# Patient Record
Sex: Male | Born: 1960 | Race: White | Hispanic: No | Marital: Married
Health system: Midwestern US, Academic
[De-identification: ages and names within clinical notes are randomized; demographics above are authoritative.]

---

## 2017-03-25 MED ORDER — METHYLPREDNISOLONE ACETATE 80 MG/ML IJ SUSP
80 mg | Freq: Once | INTRAMUSCULAR | 0 refills | Status: CP
Start: 2017-03-25 — End: ?

## 2017-03-25 MED ORDER — IOPAMIDOL 41 % IT SOLN
2.5 mL | Freq: Once | EPIDURAL | 0 refills | Status: CP
Start: 2017-03-25 — End: ?

## 2017-03-25 MED ORDER — BUPIVACAINE (PF) 0.25 % (2.5 MG/ML) IJ SOLN
2 mL | Freq: Once | INTRAMUSCULAR | 0 refills | Status: CP
Start: 2017-03-25 — End: ?

## 2017-07-27 ENCOUNTER — Encounter: Admit: 2017-07-27 | Discharge: 2017-07-27 | Payer: Commercial Managed Care - PPO

## 2017-07-27 DIAGNOSIS — E785 Hyperlipidemia, unspecified: Principal | ICD-10-CM

## 2017-07-27 DIAGNOSIS — R69 Illness, unspecified: Principal | ICD-10-CM

## 2017-07-27 DIAGNOSIS — E039 Hypothyroidism, unspecified: ICD-10-CM

## 2017-07-27 DIAGNOSIS — G8929 Other chronic pain: ICD-10-CM

## 2017-07-27 DIAGNOSIS — M545 Low back pain: ICD-10-CM

## 2017-07-27 DIAGNOSIS — K2970 Gastritis, unspecified, without bleeding: ICD-10-CM

## 2017-07-27 DIAGNOSIS — M79601 Pain in right arm: Secondary | ICD-10-CM

## 2017-07-27 DIAGNOSIS — G2581 Restless legs syndrome: ICD-10-CM

## 2017-07-27 MED ORDER — AMITRIPTYLINE 25 MG PO TAB
ORAL_TABLET | Freq: Every evening | 2 refills | Status: AC
Start: 2017-07-27 — End: 2018-04-29

## 2017-07-27 NOTE — Progress Notes
Date of Service: 07/27/2017     Subjective:               Joshua Poole. is a 56 y.o. male.        History of Present Illness    Joshua Vasquez is a 56 year old right-handed male referred by Dr. Ozzie Hoyle.  He was sent for neurologic opinion because of neck and back pain with radiation into the extremities and also with posture headache.    He has had a several year history of neck pain that is progressed over the past several years.  It is an aching pain with clicking and grinding feeling.  It radiates to both of his arms.  He says it goes down laterally in the arms into the medial digits of both hands.  This is worse with activity.  It is improved with ice.  It happens all day every day and makes it very difficult for him to sleep.    He is also had fears severe headaches.  This is also been going on for several years.  It is located throughout the head but most prominently in the back of the head.  He grades 9/10 at maximum severity.  Headaches happen daily and wake him from sleep.    He is also complained of chronic low back pain for all my life.  He notes a history of 3 lumbar surgeries the last of which was in 2008.  There is pain that radiates posteriorly down both legs more prominently on the left than on the right.  Involves the outer thigh into the front of the leg and top of the foot with associated tingling in the bottom of the left foot.    He is tried gabapentin.  He could not afford Lyrica.  He did not tolerate Cymbalta.  He has had cervical and lumbar injections and nerve ablations.  He is seen anesthesia pain management.  Has never tried tricyclic antidepressant that he can recall.    He is seen at 2 different neurosurgeons.  He says that he was told that there is nothing surgical to do.  He says that a Careers adviser a told him that he could not surgically treat arthritis.  He seen an outside neurologist also but I have no records from that provider. Joshua Vasquez admits to tingling throughout the whole body from his chin down to his toes.  This happens periodically.  He denies bowel or bladder complaints.  There is been weakness in both arms and legs and he finds it difficult to lift, climb or squeeze.  Grip strength has decreased.  It is symmetric and right and left sides.  He has a hard time with stairs, getting out of a chair or out of a car.  He is cautious with walking due to imbalance.    He has multiple other somatic complaints noted in review of systems today.    He had an MRI of the cervical spine done December 03, 2016 at [facility].  I do not have the images unfortunately right now.  According to the report there is cervical spondylosis with neuroforaminal stenosis at multiple levels most pronounced C3-C4.  Evidently there is effacement of the anterior thecal sac with slight cord flattening.  Cervical cord signal and morphology by report was normal.  No syrinx or myelomalacia.  We will get images for review.    MRI lumbar spine December 2016 showed postsurgical changes L3-4 and L4-5 with worsening intervertebral disc space narrowing  and bilateral foraminal stenosis L3-4 and 4-5    MRI brain December 05, 2015 read as age appropriate atrophy.  Otherwise normal contrast-enhanced MRI by report only.    Patient says he even contacted the [facility] but has not had any relief from his symptoms.       Review of Systems   Constitutional: Positive for diaphoresis and fatigue.   HENT: Positive for congestion, hearing loss, rhinorrhea, sinus pain and tinnitus.    Eyes: Positive for photophobia and pain.   Respiratory: Positive for apnea and shortness of breath.    Cardiovascular: Negative.    Gastrointestinal: Negative.    Endocrine: Positive for cold intolerance, heat intolerance and polydipsia.   Genitourinary: Positive for difficulty urinating and urgency.   Musculoskeletal: Positive for arthralgias, back pain, myalgias, neck pain and neck stiffness. Skin: Negative.    Allergic/Immunologic: Positive for environmental allergies.   Neurological: Positive for tremors, weakness and headaches.   Hematological: Negative.    Psychiatric/Behavioral: Positive for agitation and decreased concentration. The patient is nervous/anxious.        Chief Complaint:  Chief Complaint   Patient presents with   ??? Headache   ??? Numbness     and pain worse in upper extremities       Past Medical History:  Past Medical History:   Diagnosis Date   ??? Chronic pain    ??? Dyslipidemia 07/09/2010   ??? Gastritis    ??? Hyperlipemia 07/09/2010   ??? Hypothyroidism    ??? Lower back pain 07/09/2010   ??? Shortness of breath on exertion 07/09/2010       Surgical History:  Past Surgical History:   Procedure Laterality Date   ??? ELBOW SURGERY Right     x2   ??? HAND SURGERY Left     thumb   ??? HX BACK SURGERY     ??? HX CARPAL TUNNEL RELEASE Right    ??? HX TONSILLECTOMY     ??? HX VASECTOMY     ??? KNEE ARTHROSCOPY Right        Social History:   Social History     Social History   ??? Marital status: Married     Spouse name: N/A   ??? Number of children: N/A   ??? Years of education: N/A     Occupational History   ??? Not on file.     Social History Main Topics   ??? Smoking status: Former Smoker     Packs/day: 1.50     Years: 6.00     Types: Cigarettes     Start date: 12/01/1973     Quit date: 12/02/1979   ??? Smokeless tobacco: Former User     Types: Chew     Quit date: 12/01/2012   ??? Alcohol use 1.2 oz/week     1 Glasses of wine, 1 Cans of beer per week      Comment: 1 per week, done some heavy drinking in past   ??? Drug use: Yes     Types: Marijuana      Comment: ointment daily   ??? Sexual activity: Not on file     Other Topics Concern   ??? Not on file     Social History Narrative   ??? No narrative on file       Family History:  Family History   Problem Relation Age of Onset   ??? Hyperlipidemia Mother    ??? Arthritis-osteo Mother    ??? Arthritis-osteo Father    ???  Heart Disease Paternal Grandfather    ??? Cancer Sister         leukemia ??? Hyperlipidemia Brother    ??? Thyroid Disease Maternal Grandmother    ??? Cancer Maternal Grandmother         lung   ??? Hearing Loss Maternal Grandfather    ??? Alcohol abuse Paternal Grandmother        Allergies:  Allergies   Allergen Reactions   ??? Latex, Natural Rubber BLISTERS   ??? Lortab Asa [Hydrocodone-Aspirin] ITCHING   ??? Seasonal Allergies HEADACHE       Objective:         ??? BUPROPION HCL (WELLBUTRIN PO) Take  by mouth.   ??? cetirizine (ZYRTEC) 10 mg PO tablet Take 10 mg by mouth daily.   ??? DOCOSAHEXANOIC ACID/EPA (FISH OIL PO) Take  by mouth daily.     ??? gabapentin (NEURONTIN) 600 mg tablet Take 1 Tab by mouth four times daily.   ??? GARLIC PO Take  by mouth.   ??? GLUCOSAMINE SULFATE PO Take 200 mg by mouth twice daily.   ??? LEVOTHYROXINE SODIUM (LEVOTHYROXINE PO) Take 75 mcg by mouth daily.   ??? OMEPRAZOLE PO Take  by mouth.   ??? oxycodone HCl (OXYCODONE PO) Take 30 mg by mouth every 6 hours as needed.   ??? PRAMIPEXOLE DI-HCL (PRAMIPEXOLE PO) Take 10 mg by mouth.   ??? TURMERIC ROOT EXTRACT PO Take 1 tablet by mouth daily.   ??? ubidecarenone (COQ-10 PO) Take  by mouth.     Vitals:    07/27/17 0806   BP: 104/71   Pulse: 77   Resp: 16   Temp: 36.9 ???C (98.4 ???F)   TempSrc: Oral   SpO2: 96%   Weight: 83.9 kg (185 lb)   Height: 177.8 cm (70)     Body mass index is 26.54 kg/m???.     No Data Recorded  Is a controlled substance agreement on file? Not Applicable      Physical Exam    General: WG/WN/WD, ASA, flat affect, demeanor, attention and memory appropriate, calm, cooperative, follows commands, multiple somatic complaints  HEENT: NC/AT  Cardiac: RRR without murmur  Neck: supple, full passive ROM, no nuchal rigidity  Fundoscopy: clear, sharp disc margins, no papilledema  Speech: fluent, no dysarthria or aphasia  Mental status: Alert, oriented x 4, NAD  CN: VFF without cut/hemianopsia, PERRL, EOMI without restriction or nystagmus, facial sensation symmetric (V1-V3) to LT bilaterally, No facial droop or ptosis, hearing decreased AS compared to AD to FR, palatal rise symmetric, cough response intact, shoulder shrug symmetric, tongue midline  Motor: tone normal, no rigidity or spasticity, decreased ROM in supination BUE  Strength: 5-/5- SA, 5/5 EF/EE/WE/WF/FA, 4/4TA/TO, 5/5HF/KE/DF/PF/AEV/AIV with some bil pain limitation  Mild BUE postural tremor, mild cogwheeling at elbows, no rigidity or spasticity  Sensation: decreased PP to right shin and left ankle, vibration 10 sec left GT, 12 sec right GT, no other LE dermatomal deficit, fluctuation and inconsistent, nondermatomal patter to PP in UEs with no definite nerve pattern   DTRs, 2/2 in BR/B 1/2+P 1/1A, downgoing plantar reflexes bilaterally  Coordination: normal FTN without dysmetria, normal finger tap, coordination normal  Gait: antalgic, unable to stand/walk well on left heel/left toes, mild ataxia in tandem         Assessment and Plan:    1.  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  Cervicogenic headache  3.  Possible bilateral carpal tunnel syndrome  4.  Lumbar spondylosis with possible left sided sciatica  5.  Disturbance of skin sensation.  Consider cervical and/or lumbar radiculopathy.  No exam features of polyneuropathy.  No upper motor neuron deficits on exam either.    Plan:  1.  I suspect many of his symptoms are musculoskeletal rather than neurologic.  2.  We will do EMG and nerve conduction testing both upper and left lower extremities to try to establish whether there is radiculopathy, nerve entrapment or other peripheral nervous system contribution to his symptoms.  He has some exam features suggestive of carpal tunnel syndrome.  3.  I am referring him to medical pain management at the [facility] center.  4.  Staff will get MRI films for my review  5.  Start trial of amitriptyline 25 mg at bedtime for 7 days then 50 mg at bedtime  6.  No further new imaging necessary now 7.  I told patient that it is probable that there will be little that I can offer neurologically for him.  This is particularly true if EMG does not show sign of peripheral nerve lesion.  I will base any further recommendations upon the results of testing.  He will need a chronic medical pain manager given his chronic diffuse probably musculoskeletal pain.  8.  Return to clinic in approximately 3-4 months.  I will see him before then for EMG of both arms and left leg.

## 2017-07-28 ENCOUNTER — Ambulatory Visit: Admit: 2017-07-27 | Discharge: 2017-07-28 | Payer: 59

## 2017-07-28 DIAGNOSIS — R51 Headache: ICD-10-CM

## 2017-07-28 DIAGNOSIS — M542 Cervicalgia: ICD-10-CM

## 2017-07-28 DIAGNOSIS — G8929 Other chronic pain: ICD-10-CM

## 2017-07-28 DIAGNOSIS — R209 Unspecified disturbances of skin sensation: ICD-10-CM

## 2017-07-28 DIAGNOSIS — M5440 Lumbago with sciatica, unspecified side: Principal | ICD-10-CM

## 2017-07-28 DIAGNOSIS — M79602 Pain in left arm: ICD-10-CM

## 2017-08-11 ENCOUNTER — Encounter: Admit: 2017-08-11 | Discharge: 2017-08-11 | Payer: Commercial Managed Care - PPO

## 2017-08-11 NOTE — Telephone Encounter
Returned call to pt. Informed we do have images in our PAC of his MRI Cspine. Put in Dr. Dorris FetchSouthwell's box for review. Added pt to wait list for sooner EMG. Jean RosenthalJackson has reviewed images, and not overly concerned by images.

## 2017-08-31 ENCOUNTER — Encounter: Admit: 2017-08-31 | Discharge: 2017-08-31 | Payer: Commercial Managed Care - PPO

## 2017-08-31 ENCOUNTER — Ambulatory Visit: Admit: 2017-08-31 | Discharge: 2017-09-01 | Payer: 59

## 2017-08-31 DIAGNOSIS — G2581 Restless legs syndrome: ICD-10-CM

## 2017-08-31 DIAGNOSIS — G8929 Other chronic pain: ICD-10-CM

## 2017-08-31 DIAGNOSIS — E039 Hypothyroidism, unspecified: ICD-10-CM

## 2017-08-31 DIAGNOSIS — M79601 Pain in right arm: ICD-10-CM

## 2017-08-31 DIAGNOSIS — M545 Low back pain: ICD-10-CM

## 2017-08-31 DIAGNOSIS — E785 Hyperlipidemia, unspecified: Secondary | ICD-10-CM

## 2017-08-31 DIAGNOSIS — K2970 Gastritis, unspecified, without bleeding: ICD-10-CM

## 2017-08-31 DIAGNOSIS — M542 Cervicalgia: ICD-10-CM

## 2017-08-31 DIAGNOSIS — M5440 Lumbago with sciatica, unspecified side: ICD-10-CM

## 2017-08-31 NOTE — Procedures
Please refer to EMG report for study results.  No separate note placed for procedure.  Patient well tolerated the procedure and was sent home with no complications.  Because of chest/thoracic numbness and tingling, I have ordered MRI thoracic spine without GAD.

## 2017-09-01 DIAGNOSIS — R200 Anesthesia of skin: ICD-10-CM

## 2017-09-01 DIAGNOSIS — R202 Paresthesia of skin: Secondary | ICD-10-CM

## 2017-09-01 DIAGNOSIS — M47812 Spondylosis without myelopathy or radiculopathy, cervical region: Principal | ICD-10-CM

## 2017-09-21 ENCOUNTER — Encounter: Admit: 2017-09-21 | Discharge: 2017-09-21 | Payer: Commercial Managed Care - PPO

## 2017-09-21 NOTE — Progress Notes
Faxed order for MRI Thoracic Spine to [facility] @ [phone] per patient request.

## 2017-09-24 ENCOUNTER — Encounter: Admit: 2017-09-24 | Discharge: 2017-09-25 | Payer: Commercial Managed Care - PPO

## 2017-09-24 DIAGNOSIS — R69 Illness, unspecified: Principal | ICD-10-CM

## 2017-11-04 ENCOUNTER — Encounter: Admit: 2017-11-04 | Discharge: 2017-11-04 | Payer: Commercial Managed Care - PPO

## 2017-11-04 ENCOUNTER — Ambulatory Visit: Admit: 2017-11-04 | Discharge: 2017-11-05 | Payer: 59

## 2017-11-04 DIAGNOSIS — G8929 Other chronic pain: ICD-10-CM

## 2017-11-04 DIAGNOSIS — G2581 Restless legs syndrome: ICD-10-CM

## 2017-11-04 DIAGNOSIS — K2970 Gastritis, unspecified, without bleeding: ICD-10-CM

## 2017-11-04 DIAGNOSIS — E039 Hypothyroidism, unspecified: ICD-10-CM

## 2017-11-04 DIAGNOSIS — M545 Low back pain: ICD-10-CM

## 2017-11-04 DIAGNOSIS — E785 Hyperlipidemia, unspecified: Secondary | ICD-10-CM

## 2017-11-04 MED ORDER — GABAPENTIN 600 MG PO TAB
600 mg | ORAL_TABLET | Freq: Four times a day (QID) | ORAL | 3 refills | Status: CN
Start: 2017-11-04 — End: ?

## 2017-11-04 MED ORDER — AMITRIPTYLINE 25 MG PO TAB
25 mg | ORAL_TABLET | Freq: Every evening | ORAL | 3 refills | Status: CN
Start: 2017-11-04 — End: ?

## 2017-11-04 NOTE — Progress Notes
Date of Service: 11/04/2017     Subjective:               Joshua Vasquez. is a 56 y.o. male.        History of Present Illness    Headache and neck pain has been unchanged.  He still has neck pain that radiates into both arms with numbness and tingling.  EMG showed only a mild left ulnar entrapment at the elbow with no radiculopathy or plexopathy.    Headache is also been unchanged.  It happens every day.  It starts by coming from the neck into the left occipital region and then traveling globally.  I put him on amitriptyline which he did not tolerate in the centimeter him feel goofy.  I also referred him to the [facility] but he says he cannot afford it and insurance did not cover it.  Headache is persistent lasting well more than 4 hours.  He admits to some periods of light and noise sensitivity.    I looked at the MRI of the cervical spine that he had in January 2018.  It was abnormal with mild central stenosis and multilevel neuroforaminal stenosis.  No cord parenchymal abnormality was present.    MRI thoracic spine also was done.  I do not have the report or images right now for my review.  We have requested them from [facility].    He has not had any new neurological complaints.       Review of Systems   Constitutional: Positive for diaphoresis.   HENT: Positive for hearing loss, rhinorrhea, sinus pain and tinnitus.    Eyes: Positive for photophobia and itching.   Respiratory: Positive for apnea, cough and shortness of breath.    Cardiovascular: Negative.    Gastrointestinal: Negative.    Endocrine: Negative.    Genitourinary: Negative.    Musculoskeletal: Positive for arthralgias, back pain, neck pain and neck stiffness.   Skin: Negative.    Allergic/Immunologic: Positive for environmental allergies.   Neurological: Positive for tremors, weakness, numbness and headaches. Negative for seizures.   Hematological: Negative.    Psychiatric/Behavioral: Positive for agitation, decreased concentration and sleep disturbance.       Chief Complaint:  Chief Complaint   Patient presents with   ??? Headache     Have not changed   ??? Numbness     in BLE /more on the LT side as of now       Past Medical History:  Past Medical History:   Diagnosis Date   ??? Chronic pain    ??? Dyslipidemia 07/09/2010   ??? Gastritis    ??? Hyperlipemia 07/09/2010   ??? Hypothyroidism    ??? Lower back pain 07/09/2010   ??? RLS (restless legs syndrome)        Surgical History:  Past Surgical History:   Procedure Laterality Date   ??? ELBOW SURGERY Right     x2   ??? HAND SURGERY Left     thumb   ??? HX BACK SURGERY     ??? HX CARPAL TUNNEL RELEASE Right    ??? HX TONSILLECTOMY     ??? HX VASECTOMY     ??? KNEE ARTHROSCOPY Right        Social History:   Social History     Social History   ??? Marital status: Married     Spouse name: N/A   ??? Number of children: N/A   ??? Years of education:  N/A     Occupational History   ??? Not on file.     Social History Main Topics   ??? Smoking status: Former Smoker     Packs/day: 1.50     Years: 6.00     Types: Cigarettes     Start date: 12/01/1973     Quit date: 12/02/1979   ??? Smokeless tobacco: Former User     Types: Chew     Quit date: 12/01/2012   ??? Alcohol use 1.2 oz/week     1 Glasses of wine, 1 Cans of beer per week      Comment: 1 per week, done some heavy drinking in past   ??? Drug use: Yes     Types: Marijuana      Comment: ointment daily   ??? Sexual activity: Not on file     Other Topics Concern   ??? Not on file     Social History Narrative   ??? No narrative on file       Family History:  Family History   Problem Relation Age of Onset   ??? Hyperlipidemia Mother    ??? Arthritis-osteo Mother    ??? Arthritis-osteo Father    ??? Cancer Sister         leukemia   ??? Hyperlipidemia Brother    ??? Cancer Maternal Grandmother         lung       Allergies:  Allergies   Allergen Reactions   ??? Latex, Natural Rubber BLISTERS   ??? Lortab Asa [Hydrocodone-Aspirin] ITCHING   ??? Seasonal Allergies HEADACHE       Objective: ??? amitriptyline (ELAVIL) 25 mg tablet Take 1/2 tab at bedtime for 1 week then 1 tab at bedtime   ??? BUPROPION HCL (WELLBUTRIN PO) Take 150 mg by mouth.   ??? cetirizine (ZYRTEC) 10 mg PO tablet Take 10 mg by mouth daily.   ??? DOCOSAHEXANOIC ACID/EPA (FISH OIL PO) Take  by mouth daily.     ??? gabapentin (NEURONTIN) 600 mg tablet Take 1 Tab by mouth four times daily.   ??? GARLIC PO Take  by mouth.   ??? GLUCOSAMINE SULFATE PO Take 200 mg by mouth twice daily.   ??? LEVOTHYROXINE SODIUM (LEVOTHYROXINE PO) Take 75 mcg by mouth daily.   ??? OMEPRAZOLE PO Take  by mouth.   ??? oxycodone HCl (OXYCODONE PO) Take 30 mg by mouth every 6 hours as needed.   ??? PRAMIPEXOLE DI-HCL (PRAMIPEXOLE PO) Take 10 mg by mouth.   ??? TURMERIC ROOT EXTRACT PO Take 1 tablet by mouth daily.   ??? ubidecarenone (COQ-10 PO) Take  by mouth.     Vitals:    11/04/17 1056   BP: 125/85   Pulse: 83   Resp: 16   Temp: 37.1 ???C (98.7 ???F)   TempSrc: Oral   SpO2: 96%   Weight: 85.7 kg (189 lb)   Height: 177.8 cm (70)     Body mass index is 27.12 kg/m???.     No Data Recorded  Is a controlled substance agreement on file? Not Applicable      Physical Exam    General: WG/WN, flat demeanor  HEENT: NC/AT  Cardiac: RRR without murmur  Speech: fluent, no dysarthria or aphasia  Mental status: Alert, oriented x 4, NAD  CN: PERRL, EOMI, facial sensation symmetric (V1-V3) to LT bilaterally, No facial droop or ptosis, palatal rise symmetric,tongue midline  Strength: 5/5 SA/EF/EE/WE/WF/HF/KE/DF/PF  Sensation: Mild chronic decreased right medial  leg below knee, no other consistent dermatomal deficit in arms or legs    DTRs, 2/2 in BR/B 1/2P 1/1A, downgoing plantar reflexes bilaterally  Gait: antalgic, no ataxia primary gait   ???     Assessment and Plan:    1.  Chronic cervicogenic headache  2.  Chronic cervicalgia with bilateral upper extremity pain.  No evidence cervical radiculopathy  3.  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  Cervical spondylosis without myelopathy or radiculopathy  5.  Lumbar spondylosis    Plan:  1.  I asked my staff to get MRI thoracic spine results from [facility].  I also asked him to call me tomorrow for report since I do not have it now.  2.  I do not think his symptoms are neurologic.  I think they are more likely to be musculoskeletal.  3.  He was not able to [facility] to the [facility].  Insurance evidently did not cover going over there.  Check to see if there is another medical pain manager who will accept his insurance.  I think he needs chronic medical pain management.  4.  He has had prior cervical injections.  He already saw Dr. Park Breed in anesthesia pain management.  5  I have nothing else neurologically to offer him.  Discharge to the care of his primary care provider and medical pain management as referred. RTC prn.

## 2017-11-04 NOTE — Telephone Encounter
Vm, MRI t spine from [facility] received and reviewed by Dr. Sunday CornSouthwell. Largely arthritic changes and he is not overly concerned with results. Please call back if you have questions.

## 2017-11-05 ENCOUNTER — Encounter: Admit: 2017-11-05 | Discharge: 2017-11-05 | Payer: Commercial Managed Care - PPO

## 2017-11-05 DIAGNOSIS — R51 Headache: Principal | ICD-10-CM

## 2017-11-05 DIAGNOSIS — R69 Illness, unspecified: Principal | ICD-10-CM

## 2018-01-25 ENCOUNTER — Encounter: Admit: 2018-01-25 | Discharge: 2018-01-26 | Payer: Commercial Managed Care - PPO

## 2018-03-04 ENCOUNTER — Ambulatory Visit: Admit: 2018-03-04 | Discharge: 2018-03-05

## 2018-03-05 DIAGNOSIS — M79644 Pain in right finger(s): Principal | ICD-10-CM

## 2018-03-06 ENCOUNTER — Encounter: Admit: 2018-03-06 | Discharge: 2018-03-06 | Payer: Commercial Managed Care - PPO

## 2018-03-24 ENCOUNTER — Encounter: Admit: 2018-03-24 | Discharge: 2018-03-24 | Payer: Commercial Managed Care - PPO

## 2018-04-29 ENCOUNTER — Encounter: Admit: 2018-04-29 | Discharge: 2018-04-29 | Payer: Commercial Managed Care - PPO

## 2018-04-29 ENCOUNTER — Ambulatory Visit: Admit: 2018-04-29 | Discharge: 2018-04-30

## 2018-04-29 DIAGNOSIS — G8929 Other chronic pain: ICD-10-CM

## 2018-04-29 DIAGNOSIS — G2581 Restless legs syndrome: ICD-10-CM

## 2018-04-29 DIAGNOSIS — M545 Low back pain: ICD-10-CM

## 2018-04-29 DIAGNOSIS — E039 Hypothyroidism, unspecified: ICD-10-CM

## 2018-04-29 DIAGNOSIS — E785 Hyperlipidemia, unspecified: Principal | ICD-10-CM

## 2018-04-29 DIAGNOSIS — K2970 Gastritis, unspecified, without bleeding: ICD-10-CM

## 2018-04-29 MED ORDER — BETAMETHASONE ACET,SOD PHOS 6 MG/ML IJ SUSP
6 mg | Freq: Once | INTRAMUSCULAR | 0 refills | Status: CP | PRN
Start: 2018-04-29 — End: ?
  Administered 2018-04-29: 15:00:00 6 mg via INTRAMUSCULAR

## 2018-04-29 MED ORDER — LIDOCAINE (PF) 10 MG/ML (1 %) IJ SOLN
3 mL | Freq: Once | INTRAMUSCULAR | 0 refills | Status: CP | PRN
Start: 2018-04-29 — End: ?
  Administered 2018-04-29: 15:00:00 3 mL via INTRAMUSCULAR

## 2018-04-30 DIAGNOSIS — M65331 Trigger finger, right middle finger: Principal | ICD-10-CM

## 2018-07-15 ENCOUNTER — Ambulatory Visit: Admit: 2018-07-15 | Discharge: 2018-07-16 | Payer: Commercial Managed Care - PPO

## 2018-07-16 DIAGNOSIS — M65331 Trigger finger, right middle finger: Principal | ICD-10-CM

## 2018-08-20 ENCOUNTER — Ambulatory Visit: Admit: 2018-08-20 | Discharge: 2018-08-20 | Payer: Commercial Managed Care - PPO

## 2018-08-20 ENCOUNTER — Encounter: Admit: 2018-08-20 | Discharge: 2018-08-20 | Payer: Commercial Managed Care - PPO

## 2018-08-20 DIAGNOSIS — M24541 Contracture, right hand: Principal | ICD-10-CM

## 2018-08-20 MED ORDER — CEFAZOLIN 1 GRAM IJ SOLR
2 g | Freq: Once | INTRAVENOUS | 0 refills | Status: CN
Start: 2018-08-20 — End: ?

## 2018-10-21 ENCOUNTER — Encounter: Admit: 2018-10-21 | Discharge: 2018-10-22 | Payer: Commercial Managed Care - PPO

## 2018-10-27 ENCOUNTER — Encounter: Admit: 2018-10-27 | Discharge: 2018-10-27 | Payer: Commercial Managed Care - PPO

## 2018-10-27 ENCOUNTER — Ambulatory Visit: Admit: 2018-10-27 | Discharge: 2018-10-28 | Payer: Commercial Managed Care - PPO

## 2018-10-27 DIAGNOSIS — E785 Hyperlipidemia, unspecified: Secondary | ICD-10-CM

## 2018-10-27 DIAGNOSIS — S46212A Strain of muscle, fascia and tendon of other parts of biceps, left arm, initial encounter: Principal | ICD-10-CM

## 2018-10-27 DIAGNOSIS — G2581 Restless legs syndrome: ICD-10-CM

## 2018-10-27 DIAGNOSIS — M545 Low back pain: ICD-10-CM

## 2018-10-27 DIAGNOSIS — K2970 Gastritis, unspecified, without bleeding: ICD-10-CM

## 2018-10-27 DIAGNOSIS — G8929 Other chronic pain: ICD-10-CM

## 2018-10-27 DIAGNOSIS — E039 Hypothyroidism, unspecified: ICD-10-CM

## 2018-10-28 DIAGNOSIS — S46212A Strain of muscle, fascia and tendon of other parts of biceps, left arm, initial encounter: Principal | ICD-10-CM

## 2018-11-01 DIAGNOSIS — S46219A Strain of muscle, fascia and tendon of other parts of biceps, unspecified arm, initial encounter: ICD-10-CM

## 2018-11-02 ENCOUNTER — Encounter: Admit: 2018-11-02 | Discharge: 2018-11-02 | Payer: Commercial Managed Care - PPO

## 2018-11-02 DIAGNOSIS — G2581 Restless legs syndrome: ICD-10-CM

## 2018-11-02 DIAGNOSIS — G4733 Obstructive sleep apnea (adult) (pediatric): ICD-10-CM

## 2018-11-02 DIAGNOSIS — E785 Hyperlipidemia, unspecified: ICD-10-CM

## 2018-11-02 DIAGNOSIS — K219 Gastro-esophageal reflux disease without esophagitis: ICD-10-CM

## 2018-11-02 DIAGNOSIS — E039 Hypothyroidism, unspecified: ICD-10-CM

## 2018-11-02 DIAGNOSIS — M545 Low back pain: Principal | ICD-10-CM

## 2018-11-12 ENCOUNTER — Encounter: Admit: 2018-11-12 | Discharge: 2018-11-12 | Payer: Commercial Managed Care - PPO

## 2018-11-12 ENCOUNTER — Ambulatory Visit: Admit: 2018-11-12 | Discharge: 2018-11-12 | Payer: Commercial Managed Care - PPO

## 2018-11-12 ENCOUNTER — Encounter: Admit: 2018-11-12 | Discharge: 2018-11-13 | Payer: Commercial Managed Care - PPO

## 2018-11-12 DIAGNOSIS — S46212A Strain of muscle, fascia and tendon of other parts of biceps, left arm, initial encounter: Principal | ICD-10-CM

## 2018-11-12 DIAGNOSIS — G4733 Obstructive sleep apnea (adult) (pediatric): ICD-10-CM

## 2018-11-12 DIAGNOSIS — Z885 Allergy status to narcotic agent status: ICD-10-CM

## 2018-11-12 DIAGNOSIS — G2581 Restless legs syndrome: ICD-10-CM

## 2018-11-12 DIAGNOSIS — K219 Gastro-esophageal reflux disease without esophagitis: ICD-10-CM

## 2018-11-12 DIAGNOSIS — M545 Low back pain: Principal | ICD-10-CM

## 2018-11-12 DIAGNOSIS — E039 Hypothyroidism, unspecified: ICD-10-CM

## 2018-11-12 DIAGNOSIS — E785 Hyperlipidemia, unspecified: ICD-10-CM

## 2018-11-12 MED ORDER — ACETAMINOPHEN 1,000 MG/100 ML (10 MG/ML) IV SOLN
0 refills | Status: DC
Start: 2018-11-12 — End: 2018-11-12
  Administered 2018-11-12: 17:00:00 1000 mg via INTRAVENOUS

## 2018-11-12 MED ORDER — FENTANYL CITRATE (PF) 50 MCG/ML IJ SOLN
0 refills | Status: DC
Start: 2018-11-12 — End: 2018-11-12
  Administered 2018-11-12: 16:00:00 50 ug via INTRAVENOUS
  Administered 2018-11-12 (×2): 25 ug via INTRAVENOUS
  Administered 2018-11-12 (×3): 50 ug via INTRAVENOUS

## 2018-11-12 MED ORDER — LACTATED RINGERS IV SOLP
INTRAVENOUS | 0 refills | Status: DC
Start: 2018-11-12 — End: 2018-11-12
  Administered 2018-11-12: 15:00:00 1000.000 mL via INTRAVENOUS

## 2018-11-12 MED ORDER — PROPOFOL INJ 10 MG/ML IV VIAL
0 refills | Status: DC
Start: 2018-11-12 — End: 2018-11-12
  Administered 2018-11-12: 16:00:00 200 mg via INTRAVENOUS

## 2018-11-12 MED ORDER — OXYCODONE 5 MG PO TAB
5-10 mg | ORAL_TABLET | ORAL | 0 refills | 6.00000 days | Status: AC | PRN
Start: 2018-11-12 — End: 2019-01-04
  Filled 2018-11-12 (×2): qty 50, 5d supply, fill #1

## 2018-11-12 MED ORDER — MIDAZOLAM 1 MG/ML IJ SOLN
INTRAVENOUS | 0 refills | Status: DC
Start: 2018-11-12 — End: 2018-11-12
  Administered 2018-11-12: 15:00:00 2 mg via INTRAVENOUS

## 2018-11-12 MED ORDER — OXYCODONE 5 MG PO TAB
5-10 mg | Freq: Once | ORAL | 0 refills | Status: CP
Start: 2018-11-12 — End: ?

## 2018-11-12 MED ORDER — ONDANSETRON HCL (PF) 4 MG/2 ML IJ SOLN
INTRAVENOUS | 0 refills | Status: DC
Start: 2018-11-12 — End: 2018-11-12
  Administered 2018-11-12: 16:00:00 4 mg via INTRAVENOUS

## 2018-11-12 MED ORDER — BUPIVACAINE 0.5 % (5 MG/ML) IJ SOLN
0 refills | Status: DC
Start: 2018-11-12 — End: 2018-11-12
  Administered 2018-11-12: 18:00:00 15 mL via INTRAMUSCULAR

## 2018-11-12 MED ORDER — NAPROXEN 500 MG PO TAB
500 mg | ORAL_TABLET | Freq: Two times a day (BID) | ORAL | 1 refills | Status: AC
Start: 2018-11-12 — End: 2020-05-17
  Filled 2018-11-12 (×2): qty 60, 30d supply, fill #1

## 2018-11-12 MED ORDER — PROMETHAZINE 25 MG/ML IJ SOLN
6.25 mg | INTRAVENOUS | 0 refills | Status: DC | PRN
Start: 2018-11-12 — End: 2018-11-12

## 2018-11-12 MED ORDER — PHENYLEPHRINE IN 0.9% NACL(PF) 1 MG/10 ML (100 MCG/ML) IV SYRG
INTRAVENOUS | 0 refills | Status: DC
Start: 2018-11-12 — End: 2018-11-12
  Administered 2018-11-12: 16:00:00 100 ug via INTRAVENOUS

## 2018-11-12 MED ORDER — LIDOCAINE (PF) 200 MG/10 ML (2 %) IJ SYRG
0 refills | Status: DC
Start: 2018-11-12 — End: 2018-11-12
  Administered 2018-11-12: 16:00:00 50 mg via INTRAVENOUS

## 2018-11-12 MED ORDER — FENTANYL CITRATE (PF) 50 MCG/ML IJ SOLN
50 ug | INTRAVENOUS | 0 refills | Status: DC | PRN
Start: 2018-11-12 — End: 2018-11-12
  Administered 2018-11-12 (×3): 50 ug via INTRAVENOUS

## 2018-11-12 MED ORDER — LIDOCAINE (PF) 10 MG/ML (1 %) IJ SOLN
.1-2 mL | INTRAMUSCULAR | 0 refills | Status: DC | PRN
Start: 2018-11-12 — End: 2018-11-12
  Administered 2018-11-12: 15:00:00 2 mL via INTRAMUSCULAR

## 2018-11-12 MED ORDER — CEFAZOLIN 1 GRAM IJ SOLR
0 refills | Status: DC
Start: 2018-11-12 — End: 2018-11-12
  Administered 2018-11-12: 16:00:00 2 g via INTRAVENOUS

## 2018-11-12 MED ORDER — HYDROMORPHONE (PF) 2 MG/ML IJ SYRG
2 mg | INTRAVENOUS | 0 refills | Status: DC | PRN
Start: 2018-11-12 — End: 2018-11-12
  Administered 2018-11-12: 19:00:00 2 mg via INTRAVENOUS
  Administered 2018-11-12: 19:00:00 1 mg via INTRAVENOUS

## 2018-11-12 MED ORDER — DEXAMETHASONE SODIUM PHOSPHATE 4 MG/ML IJ SOLN
INTRAVENOUS | 0 refills | Status: DC
Start: 2018-11-12 — End: 2018-11-12
  Administered 2018-11-12: 16:00:00 4 mg via INTRAVENOUS

## 2018-11-12 MED ADMIN — FENTANYL CITRATE (PF) 50 MCG/ML IJ SOLN [3037]: 50 ug | INTRAVENOUS | @ 18:00:00 | Stop: 2018-11-12 | NDC 00641602701

## 2018-11-12 MED ADMIN — OXYCODONE 5 MG PO TAB [10814]: 5 mg | ORAL | @ 19:00:00 | Stop: 2018-11-12 | NDC 00406055223

## 2018-11-15 ENCOUNTER — Encounter: Admit: 2018-11-15 | Discharge: 2018-11-15 | Payer: Commercial Managed Care - PPO

## 2018-11-15 DIAGNOSIS — E039 Hypothyroidism, unspecified: ICD-10-CM

## 2018-11-15 DIAGNOSIS — M545 Low back pain: Principal | ICD-10-CM

## 2018-11-15 DIAGNOSIS — E785 Hyperlipidemia, unspecified: ICD-10-CM

## 2018-11-15 DIAGNOSIS — G2581 Restless legs syndrome: ICD-10-CM

## 2018-11-15 DIAGNOSIS — K219 Gastro-esophageal reflux disease without esophagitis: ICD-10-CM

## 2018-11-15 DIAGNOSIS — G4733 Obstructive sleep apnea (adult) (pediatric): ICD-10-CM

## 2018-12-07 ENCOUNTER — Ambulatory Visit: Admit: 2018-12-07 | Discharge: 2018-12-08 | Payer: Commercial Managed Care - PPO

## 2018-12-08 DIAGNOSIS — S46212A Strain of muscle, fascia and tendon of other parts of biceps, left arm, initial encounter: Secondary | ICD-10-CM

## 2018-12-31 ENCOUNTER — Encounter: Admit: 2018-12-31 | Discharge: 2018-12-31 | Payer: Commercial Managed Care - PPO

## 2019-01-04 ENCOUNTER — Encounter: Admit: 2019-01-04 | Discharge: 2019-01-04 | Payer: Commercial Managed Care - PPO

## 2019-01-04 ENCOUNTER — Ambulatory Visit: Admit: 2019-01-04 | Discharge: 2019-01-05 | Payer: Commercial Managed Care - PPO

## 2019-01-04 DIAGNOSIS — G2581 Restless legs syndrome: ICD-10-CM

## 2019-01-04 DIAGNOSIS — M545 Low back pain: Principal | ICD-10-CM

## 2019-01-04 DIAGNOSIS — G4733 Obstructive sleep apnea (adult) (pediatric): ICD-10-CM

## 2019-01-04 DIAGNOSIS — E785 Hyperlipidemia, unspecified: ICD-10-CM

## 2019-01-04 DIAGNOSIS — K219 Gastro-esophageal reflux disease without esophagitis: ICD-10-CM

## 2019-01-04 DIAGNOSIS — E039 Hypothyroidism, unspecified: ICD-10-CM

## 2019-01-05 ENCOUNTER — Encounter: Admit: 2019-01-05 | Discharge: 2019-01-05 | Payer: Commercial Managed Care - PPO

## 2019-01-05 DIAGNOSIS — M5416 Radiculopathy, lumbar region: Principal | ICD-10-CM

## 2019-01-05 NOTE — Progress Notes
CC:  Chief Complaint   Patient presents with   ??? Post-op     Left bicep         S:  58 year old male return to clinic for evaluation of his left distal biceps repair 11/12/2018.  He reports his pain is well controlled he is currently very satisfied with his left arm.  He has been compliant with his restrictions he denies paresthesias.   unfortunately, he is developed a significant recurrence of his lower back pain and paresthesias into his bilateral legs.  He has a complicated history regarding his back he seen multiple spine surgeons.     O:  Left upper extremity:  He has a range of motion from 0 to 140 degrees of flexion.  He has a supination to 30 degrees which is his baseline.  He has full pronation.  Normal hook test no pain with palpation along the distal biceps tendon.  Visions are well approximated without erythema or swelling      Assessment/Plan:    58 year old male status post left distal biceps repair     -Patient may now transition out of his brace throughout the day.  He may continue all active motion as tolerated.  We will get him into active strengthening with therapy   -Place a referral to our spine team to be reevaluated for his lumbar radiculopathy      Jerline Pain, MD  Assistant Professor  Hand Upper Extremity and Microsurgery  Department of Orthopaedic Surgery

## 2019-01-21 ENCOUNTER — Encounter: Admit: 2019-01-21 | Discharge: 2019-01-21 | Payer: Commercial Managed Care - PPO

## 2019-01-21 DIAGNOSIS — M549 Dorsalgia, unspecified: ICD-10-CM

## 2019-01-21 DIAGNOSIS — M5440 Lumbago with sciatica, unspecified side: Principal | ICD-10-CM

## 2019-02-01 ENCOUNTER — Encounter: Admit: 2019-02-01 | Discharge: 2019-02-02 | Payer: Commercial Managed Care - PPO

## 2019-02-08 ENCOUNTER — Encounter: Admit: 2019-02-08 | Discharge: 2019-02-08 | Payer: Commercial Managed Care - PPO

## 2019-02-08 ENCOUNTER — Ambulatory Visit: Admit: 2019-02-08 | Discharge: 2019-02-09 | Payer: Commercial Managed Care - PPO

## 2019-02-08 DIAGNOSIS — K219 Gastro-esophageal reflux disease without esophagitis: ICD-10-CM

## 2019-02-08 DIAGNOSIS — E039 Hypothyroidism, unspecified: ICD-10-CM

## 2019-02-08 DIAGNOSIS — G4733 Obstructive sleep apnea (adult) (pediatric): ICD-10-CM

## 2019-02-08 DIAGNOSIS — E785 Hyperlipidemia, unspecified: ICD-10-CM

## 2019-02-08 DIAGNOSIS — G2581 Restless legs syndrome: ICD-10-CM

## 2019-02-08 DIAGNOSIS — M545 Low back pain: Principal | ICD-10-CM

## 2019-02-08 NOTE — Progress Notes
11/12/2018.  He has recovered very well following his distal biceps repair.  He is currently very happy with his result.  He is now increasing his activity without restriction.  He is continuing with physical therapy.  Today he has complaints of a recurrence of her right long finger triggering.  He had a release 1 to 2 years ago but symptoms recurred last fall he saw my partner Dr. Maryclare Bean who provided an injection which provided him about 4 to 6 weeks of relief.  He has had a recurrence of the symptoms.  He notes having to actively extend his finger most often in the mornings he denies paresthesias      Past Medical History:  Medical History:   Diagnosis Date   ??? Acid reflux    ??? Hyperlipemia 07/09/2010   ??? Hypothyroidism    ??? Lower back pain 07/09/2010   ??? OSA on CPAP     doesnt use currently, cannot tolerate   ??? RLS (restless legs syndrome)        Past Surgical History:  Surgical History:   Procedure Laterality Date   ??? HX BACK SURGERY  1995    L 3-5   ??? REINSERTION RUPTURED BICEPS/ TRICEPS TENDON WITH/ WITHOUT TENDON GRAFT - DISTAL Left 11/12/2018    Performed by Jerline Pain, MD at [facility] OR/PERIOP   ??? ELBOW SURGERY Right     x2   ??? HAND SURGERY Left     thumb   ??? HX CARPAL TUNNEL RELEASE Right    ??? HX TONSILLECTOMY     ??? HX VASECTOMY     ??? KNEE ARTHROSCOPY Right    ??? KNEE ARTHROSCOPY Left        Family History:  Family History   Problem Relation Age of Onset   ??? Hyperlipidemia Mother    ??? Arthritis-osteo Mother    ??? Arthritis-osteo Father    ??? Cancer Sister         leukemia   ??? Hyperlipidemia Brother    ??? Cancer Maternal Grandmother         lung     Social History:   Social History     Socioeconomic History   ??? Marital status: Married     Spouse name: Not on file   ??? Number of children: Not on file   ??? Years of education: Not on file   ??? Highest education level: Not on file   Occupational History   ??? Not on file   Tobacco Use   ??? Smoking status: Former Smoker     Packs/day: 1.50     Years: 6.00

## 2019-02-09 DIAGNOSIS — M65331 Trigger finger, right middle finger: Principal | ICD-10-CM

## 2019-02-11 MED ORDER — LIDOCAINE (PF) 10 MG/ML (1 %) IJ SOLN
1 mL | Freq: Once | INTRAMUSCULAR | 0 refills | Status: CP | PRN
Start: 2019-02-11 — End: ?
  Administered 2019-02-08: 16:00:00 1 mL via INTRAMUSCULAR

## 2019-02-11 MED ORDER — TRIAMCINOLONE ACETONIDE 40 MG/ML IJ SUSP
40 mg | Freq: Once | INTRAMUSCULAR | 0 refills | Status: CP | PRN
Start: 2019-02-11 — End: ?
  Administered 2019-02-08: 16:00:00 40 mg via INTRAMUSCULAR

## 2019-02-17 ENCOUNTER — Encounter: Admit: 2019-02-17 | Discharge: 2019-02-17 | Payer: Commercial Managed Care - PPO

## 2019-02-17 DIAGNOSIS — M549 Dorsalgia, unspecified: Principal | ICD-10-CM

## 2019-02-23 ENCOUNTER — Encounter: Admit: 2019-02-23 | Discharge: 2019-02-23 | Payer: Commercial Managed Care - PPO

## 2019-02-23 ENCOUNTER — Ambulatory Visit: Admit: 2019-02-23 | Discharge: 2019-02-23 | Payer: Commercial Managed Care - PPO

## 2019-02-23 DIAGNOSIS — K219 Gastro-esophageal reflux disease without esophagitis: ICD-10-CM

## 2019-02-23 DIAGNOSIS — M545 Low back pain: Principal | ICD-10-CM

## 2019-02-23 DIAGNOSIS — E785 Hyperlipidemia, unspecified: ICD-10-CM

## 2019-02-23 DIAGNOSIS — M5416 Radiculopathy, lumbar region: ICD-10-CM

## 2019-02-23 DIAGNOSIS — G4733 Obstructive sleep apnea (adult) (pediatric): ICD-10-CM

## 2019-02-23 DIAGNOSIS — E039 Hypothyroidism, unspecified: ICD-10-CM

## 2019-02-23 DIAGNOSIS — M549 Dorsalgia, unspecified: Principal | ICD-10-CM

## 2019-02-23 DIAGNOSIS — G2581 Restless legs syndrome: ICD-10-CM

## 2019-02-23 DIAGNOSIS — M47817 Spondylosis without myelopathy or radiculopathy, lumbosacral region: ICD-10-CM

## 2019-06-25 ENCOUNTER — Encounter: Admit: 2019-06-25 | Discharge: 2019-06-25

## 2020-02-29 ENCOUNTER — Encounter: Admit: 2020-02-29 | Discharge: 2020-02-29 | Payer: Commercial Managed Care - PPO

## 2020-05-17 ENCOUNTER — Ambulatory Visit: Admit: 2020-05-17 | Discharge: 2020-05-17 | Payer: Commercial Managed Care - PPO

## 2020-05-17 ENCOUNTER — Encounter: Admit: 2020-05-17 | Discharge: 2020-05-17 | Payer: Commercial Managed Care - PPO

## 2020-05-17 DIAGNOSIS — K219 Gastro-esophageal reflux disease without esophagitis: Secondary | ICD-10-CM

## 2020-05-17 DIAGNOSIS — G5793 Unspecified mononeuropathy of bilateral lower limbs: Secondary | ICD-10-CM

## 2020-05-17 DIAGNOSIS — E785 Hyperlipidemia, unspecified: Secondary | ICD-10-CM

## 2020-05-17 DIAGNOSIS — G4733 Obstructive sleep apnea (adult) (pediatric): Secondary | ICD-10-CM

## 2020-05-17 DIAGNOSIS — G2581 Restless legs syndrome: Secondary | ICD-10-CM

## 2020-05-17 DIAGNOSIS — M545 Low back pain: Secondary | ICD-10-CM

## 2020-05-17 DIAGNOSIS — E039 Hypothyroidism, unspecified: Secondary | ICD-10-CM

## 2020-05-22 ENCOUNTER — Encounter: Admit: 2020-05-22 | Discharge: 2020-05-22 | Payer: Commercial Managed Care - PPO

## 2020-05-22 NOTE — Telephone Encounter
Pt was seen in clinic with Joshua Vasquez and Dr Welton Flakes 05/17/2020.At that time he was on pregabailin 200 mg BID. He says that Dr Welton Flakes and Sabra Heck indicated they were going to increase him to 300 mg BID, however there is no documentaion to that effect.

## 2020-05-25 MED ORDER — PREGABALIN 300 MG PO CAP
300 mg | ORAL_CAPSULE | Freq: Two times a day (BID) | ORAL | 3 refills | Status: AC
Start: 2020-05-25 — End: ?

## 2020-05-30 ENCOUNTER — Encounter: Admit: 2020-05-30 | Discharge: 2020-05-30 | Payer: Commercial Managed Care - PPO

## 2020-05-30 DIAGNOSIS — M47817 Spondylosis without myelopathy or radiculopathy, lumbosacral region: Secondary | ICD-10-CM

## 2020-05-31 ENCOUNTER — Ambulatory Visit: Admit: 2020-05-31 | Discharge: 2020-05-31 | Payer: Commercial Managed Care - PPO

## 2020-05-31 ENCOUNTER — Encounter: Admit: 2020-05-31 | Discharge: 2020-05-31 | Payer: Commercial Managed Care - PPO

## 2020-05-31 DIAGNOSIS — R200 Anesthesia of skin: Secondary | ICD-10-CM

## 2020-05-31 DIAGNOSIS — M47817 Spondylosis without myelopathy or radiculopathy, lumbosacral region: Secondary | ICD-10-CM

## 2020-05-31 DIAGNOSIS — G5793 Unspecified mononeuropathy of bilateral lower limbs: Secondary | ICD-10-CM

## 2020-05-31 MED ORDER — METHYLPREDNISOLONE ACETATE 80 MG/ML IJ SUSP
80 mg | Freq: Once | INTRAMUSCULAR | 0 refills | Status: CP
Start: 2020-05-31 — End: ?
  Administered 2020-05-31: 18:00:00 80 mg via INTRAMUSCULAR

## 2020-05-31 MED ORDER — IOPAMIDOL 41 % IT SOLN
2.5 mL | Freq: Once | EPIDURAL | 0 refills | Status: CP
Start: 2020-05-31 — End: ?
  Administered 2020-05-31: 18:00:00 2.5 mL via EPIDURAL

## 2020-05-31 MED ORDER — BUPIVACAINE (PF) 0.25 % (2.5 MG/ML) IJ SOLN
30 mL | Freq: Once | INTRAMUSCULAR | 0 refills | Status: CP
Start: 2020-05-31 — End: ?
  Administered 2020-05-31: 18:00:00 30 mL via INTRAMUSCULAR

## 2020-05-31 NOTE — Patient Instructions
Procedure Completed Today: Other lumbar sympathetic block    Important information following your procedure today: You may drive today    1. Pain relief may not be immediate. It is possible you may even experience an increase in pain during the first 24-48 hours followed by a gradual decrease of your pain.  2. Though the procedure is generally safe and complications are rare, we do ask that you be aware of any of the following:   ? Any swelling, persistent redness, new bleeding, or drainage from the site of the injection.  ? You should not experience a severe headache.  ? You should not run a fever over 101? F.  ? New onset of sharp, severe back & or neck pain.  ? New onset of upper or lower extremity numbness or weakness.  ? New difficulty controlling bowel or bladder function after the injection.  ? New shortness of breath.    If any of these occur, please call to report this occurrence to a nurse at [phone]. If you are calling after 4:00 p.m., on weekends or holidays please call [phone] and ask to have the resident physician on call for the physician paged or go to your local emergency room.  3. You may experience soreness at the injection site. Ice can be applied at 20 minute intervals. Avoid application of direct heat, hot showers or hot tubs today.  4. Avoid strenuous activity today. You may resume your regular activities and exercise tomorrow.  5. Patients with diabetes may see an elevation in blood sugars for 7-10 days after the injection. It is important to pay close attention to your diet, check your blood sugars daily and report extreme elevations to the physician that treats your diabetes.  6. Patients taking a daily blood thinner can resume their regular dose this evening.  7. It is important that you take all medications ordered by your pain physician. Taking medication as ordered is an important part of your pain care plan. If you cannot continue the medication plan, please notify the physician.     Possible side effects to steroids that may occur:  ? Flushing or redness of the face  ? Irritability  ? Fluid retention      The following medications were used: Lidocaine , Bupivicaine  , Depomedrol and Contrast Dye

## 2020-05-31 NOTE — Progress Notes

## 2020-06-09 ENCOUNTER — Encounter: Admit: 2020-06-09 | Discharge: 2020-06-09 | Payer: Commercial Managed Care - PPO

## 2020-08-22 ENCOUNTER — Encounter: Admit: 2020-08-22 | Discharge: 2020-08-22 | Payer: Commercial Managed Care - PPO

## 2020-08-22 DIAGNOSIS — M47817 Spondylosis without myelopathy or radiculopathy, lumbosacral region: Secondary | ICD-10-CM

## 2020-08-22 DIAGNOSIS — M5442 Lumbago with sciatica, left side: Secondary | ICD-10-CM

## 2020-09-19 ENCOUNTER — Encounter: Admit: 2020-09-19 | Discharge: 2020-09-19 | Payer: Commercial Managed Care - PPO

## 2020-09-19 DIAGNOSIS — M25521 Pain in right elbow: Secondary | ICD-10-CM

## 2020-09-20 ENCOUNTER — Encounter: Admit: 2020-09-20 | Discharge: 2020-09-20 | Payer: Commercial Managed Care - PPO

## 2020-09-20 ENCOUNTER — Ambulatory Visit: Admit: 2020-09-20 | Discharge: 2020-09-20 | Payer: Commercial Managed Care - PPO

## 2020-09-20 DIAGNOSIS — E039 Hypothyroidism, unspecified: Secondary | ICD-10-CM

## 2020-09-20 DIAGNOSIS — M545 Lower back pain: Secondary | ICD-10-CM

## 2020-09-20 DIAGNOSIS — M25521 Pain in right elbow: Secondary | ICD-10-CM

## 2020-09-20 DIAGNOSIS — G4733 Obstructive sleep apnea (adult) (pediatric): Secondary | ICD-10-CM

## 2020-09-20 DIAGNOSIS — K219 Gastro-esophageal reflux disease without esophagitis: Secondary | ICD-10-CM

## 2020-09-20 DIAGNOSIS — E785 Hyperlipidemia, unspecified: Secondary | ICD-10-CM

## 2020-09-20 DIAGNOSIS — G2581 Restless legs syndrome: Secondary | ICD-10-CM

## 2020-09-20 DIAGNOSIS — S46211A Strain of muscle, fascia and tendon of other parts of biceps, right arm, initial encounter: Secondary | ICD-10-CM

## 2020-09-20 NOTE — Patient Instructions
It was a pleasure seeing you today. Please contact us if you have any further questions, we are happy to assist.     Please call Alonna Buckler, our administrative assistant at [phone] for any work comp/administrative/disability paperwork questions. Please call our nursing line at [phone] for any nursing/medical/casting or splinting questions. Our schedulers are working remotely, therefore, they will call you to schedule your next appointment with [place] if warranted. Thank you and have a great day!    Mitchell C. Bertell Maria, MD   [facility]   Assistant Professor - Hand and Micro Surgery   Office: [phone]  Fax: [phone]    ______________________________________________________________________________________________________

## 2020-10-10 ENCOUNTER — Encounter: Admit: 2020-10-10 | Discharge: 2020-10-10 | Payer: Commercial Managed Care - PPO

## 2020-10-10 NOTE — Telephone Encounter
Patient called asking if MRI could be reviewed with Dr.Birt.     RN called patient back and stated that MRI had not been received from outside facility. Patient stated he got his MRI completed at [facility] in [place], [place]. RN stated she would request imaging and report. Patient verbalized understanding.     RN called [facility] and spoke with medical records representative Davina. RN requested MRI imaging and reports to be clouded and faxed. Medical records stated she would do that now. RN expressed appreciation.

## 2020-11-09 ENCOUNTER — Encounter: Admit: 2020-11-09 | Discharge: 2020-11-09 | Payer: Commercial Managed Care - PPO

## 2020-11-09 NOTE — Telephone Encounter
RN made phone contact with pt returning his call. Pt scheduled for telephone call visit to review MRI results on 12/04/20 @ 0830. Pt okay with waiting until after first of year since he states he won't proceed with any surgery until march or april

## 2020-12-04 ENCOUNTER — Encounter: Admit: 2020-12-04 | Discharge: 2020-12-04 | Payer: Commercial Managed Care - PPO

## 2020-12-04 ENCOUNTER — Ambulatory Visit: Admit: 2020-12-04 | Discharge: 2020-12-04 | Payer: MEDICARE

## 2020-12-04 DIAGNOSIS — K219 Gastro-esophageal reflux disease without esophagitis: Secondary | ICD-10-CM

## 2020-12-04 DIAGNOSIS — M545 Lower back pain: Secondary | ICD-10-CM

## 2020-12-04 DIAGNOSIS — G2581 Restless legs syndrome: Secondary | ICD-10-CM

## 2020-12-04 DIAGNOSIS — E785 Hyperlipidemia, unspecified: Secondary | ICD-10-CM

## 2020-12-04 DIAGNOSIS — G4733 Obstructive sleep apnea (adult) (pediatric): Secondary | ICD-10-CM

## 2020-12-04 DIAGNOSIS — M25521 Pain in right elbow: Secondary | ICD-10-CM

## 2020-12-04 DIAGNOSIS — E039 Hypothyroidism, unspecified: Secondary | ICD-10-CM

## 2020-12-04 NOTE — Patient Instructions
It was a pleasure seeing you today. Please contact us if you have any further questions, we are happy to assist.     Please call Adrianna Conley, our administrative assistant at [phone] for any work comp/administrative/disability paperwork questions. Please call our nursing line at [phone] for any nursing/medical/casting or splinting questions. Our schedulers are working remotely, therefore, they will call you to schedule your next appointment with us if warranted. Thank you and have a great day!    Mitchell C. Birt, MD   [facility]   Assistant Professor - Hand and Micro Surgery   Office: [phone]  Fax: [phone]    ______________________________________________________________________________________________________

## 2020-12-04 NOTE — Progress Notes
Orthopaedic Surgery Progress Note    Referring Provider: No ref. provider found     Assessment/Plan     I have reviewed the MRI with the patient, as well as the anticipated outcomes. I would recommend conservative treatment of this with a period of rest followed by increasing activities to tolerance. I offered him a referral to physical therapy to work on range of motion, isometrics, and ultimate eccentric strengthening. At this point, he feels that he can complete his protocol as he had previously, and he can do this independently. I would like to see him back in approximately 8 weeks for a repeat clinical exam.       Obtained patient's verbal consent to treat them and their agreement to [facility] financial policy and NPP via this telehealth visit during the [facility]         Jerline Pain, MD  Assistant Professor  Hand Upper Extremity and Microsurgery  Department of Orthopaedic Surgery       History and Physical        History of Present Illness:   This is a 60 year old male being notified via telephone regarding his recent MRI results. Briefly, the patient is well known to my clinic with previous left partial biceps rupture that ultimately underwent repair after failure of conservative measures. The patient has developed right sided pain similar to his previous over the past few months. He was previously undergoing harvest and was increasing his activity level.    Since last being seen, he was sent for an MRI which demonstrated tendinosis of the biceps tendon with a partial likely tearing of his short head distally. He says that since last being seen, he has completed harvest and has been minimizing his use of that arm. He tells me that his symptoms have for the most part resolved. He does continue to have episodes reaching away from the body in a fully extended position that results in significant pain; however, it is much improved since his previous discussion with me.      Past Medical History:  Medical History:   Diagnosis Date   ? Acid reflux    ? Hyperlipemia 07/09/2010   ? Hypothyroidism    ? Lower back pain 07/09/2010   ? OSA on CPAP     doesnt use currently, cannot tolerate   ? RLS (restless legs syndrome)        Past Surgical History:  Surgical History:   Procedure Laterality Date   ? HX BACK SURGERY  1995    L 3-5   ? REINSERTION RUPTURED BICEPS/ TRICEPS TENDON WITH/ WITHOUT TENDON GRAFT - DISTAL Left 11/12/2018    Performed by Jerline Pain, MD at [facility]   ? ELBOW SURGERY Right     x2   ? HAND SURGERY Left     thumb   ? HX CARPAL TUNNEL RELEASE Right    ? HX TONSILLECTOMY     ? HX VASECTOMY     ? KNEE ARTHROSCOPY Right    ? KNEE ARTHROSCOPY Left        Family History:  Family History   Problem Relation Age of Onset   ? Hyperlipidemia Mother    ? Arthritis-osteo Mother    ? Arthritis-osteo Father    ? Cancer Sister         leukemia   ? Hyperlipidemia Brother    ? Cancer Maternal Grandmother         lung  Social History:   Social History     Socioeconomic History   ? Marital status: Married     Spouse name: Not on file   ? Number of children: Not on file   ? Years of education: Not on file   ? Highest education level: Not on file   Occupational History   ? Not on file   Tobacco Use   ? Smoking status: Former Smoker     Packs/day: 1.50     Years: 6.00     Pack years: 9.00     Types: Cigarettes     Start date: 12/01/1973     Quit date: 12/02/1979     Years since quitting: 41.0   ? Smokeless tobacco: Former Neurosurgeon     Types: Chew     Quit date: 12/01/2012   Substance and Sexual Activity   ? Alcohol use: Not Currently   ? Drug use: Yes     Types: Marijuana     Comment: ointment daily   ? Sexual activity: Not on file   Other Topics Concern   ? Not on file   Social History Narrative   ? Not on file       Current Medications:   has a current medication list which includes the following prescription(s): alprazolam, cetirizine, docosahexaenoic acid/epa, garlic, glucosamine sulfate, levothyroxine sodium, medical supply, miscellaneous, metaxalone, omeprazole, oxycodone hcl, pregabalin, senna plus, turmeric root extract, and ubidecarenone.    Allergies:   is allergic to latex, natural rubber; lortab [hydrocodone-acetaminophen]; and seasonal allergies.      Portions of this note may have been created using Dragon, a voice recognition software program.  Please contact my office for clarification on any documentation.    ATTESTATION:  This visit was documented by Josefine Class via audio recorded by Manuela Neptune, MD, on December 04, 2020, 08:52 AM

## 2021-08-12 ENCOUNTER — Encounter: Admit: 2021-08-12 | Discharge: 2021-08-12 | Payer: Commercial Managed Care - PPO

## 2021-08-12 DIAGNOSIS — M545 Lower back pain: Secondary | ICD-10-CM

## 2021-08-12 DIAGNOSIS — G2581 Restless legs syndrome: Secondary | ICD-10-CM

## 2021-08-12 DIAGNOSIS — K219 Gastro-esophageal reflux disease without esophagitis: Secondary | ICD-10-CM

## 2021-08-12 DIAGNOSIS — E785 Hyperlipidemia, unspecified: Secondary | ICD-10-CM

## 2021-08-12 DIAGNOSIS — E039 Hypothyroidism, unspecified: Secondary | ICD-10-CM

## 2021-08-12 DIAGNOSIS — G4733 Obstructive sleep apnea (adult) (pediatric): Secondary | ICD-10-CM

## 2021-08-12 NOTE — Telephone Encounter
08-12-21-Records from Dr Herschell Dimes have been indexed into the chart. sls

## 2021-08-13 ENCOUNTER — Encounter: Admit: 2021-08-13 | Discharge: 2021-08-13 | Payer: Commercial Managed Care - PPO

## 2021-08-13 DIAGNOSIS — R079 Chest pain, unspecified: Secondary | ICD-10-CM

## 2021-08-13 DIAGNOSIS — G2581 Restless legs syndrome: Secondary | ICD-10-CM

## 2021-08-13 DIAGNOSIS — G4733 Obstructive sleep apnea (adult) (pediatric): Secondary | ICD-10-CM

## 2021-08-13 DIAGNOSIS — E785 Hyperlipidemia, unspecified: Secondary | ICD-10-CM

## 2021-08-13 DIAGNOSIS — E039 Hypothyroidism, unspecified: Secondary | ICD-10-CM

## 2021-08-13 DIAGNOSIS — Z136 Encounter for screening for cardiovascular disorders: Secondary | ICD-10-CM

## 2021-08-13 DIAGNOSIS — R0609 Other forms of dyspnea: Secondary | ICD-10-CM

## 2021-08-13 DIAGNOSIS — M545 Lower back pain: Secondary | ICD-10-CM

## 2021-08-13 DIAGNOSIS — K219 Gastro-esophageal reflux disease without esophagitis: Secondary | ICD-10-CM

## 2021-08-13 NOTE — Patient Instructions
Echo   Stress Test    [facility] Nuclear Stress Test Instructions    PLEASE REPORT TO:    ____[facility] ([phone], [place], [place]) - [phone]   ____[facility] ([address], [address], [place], [place]) - [phone]    ____[facility] ([address] [place], [place]) - [phone]    ____[facility] ([address], [address], [place], [place]) - [phone]   ____[facility] ([address], [place], [place] ) - [phone]     MAC Main Phone Number: [phone]     Date of Test  ____________  at ____________  for ________________________    Are you able to raise your arm up by your head for about 20 minutes? yes  Can you lie on your back for approximately 20 minutes with minimal movement? yes     The Thallium evaluation has two parts -- two nuclear scans.   The first scan is done in the morning and the second three to four hours later.     Wear comfortable clothing. Bring or wear comfortable walking shoes.     It is recommended not to hold infants for 2 to 3 days after the test.   Please let the nuclear technologists know if you plan on flying after the test.     NO DECAF OR CAFFEINE 24 HOURS PRIOR TO TEST. Examples: coffee, tea, decaf coffee or tea, cola, chocolate.     DO NOT EAT OR DRINK THE MORNING OF YOUR TEST unless otherwise instructed. (You may have a couple sips of water.) If you are a diabetic, if insulin dependent: please take one third of your insulin with a light breakfast (two pieces of dry toast and a small juice). Bring insulin and medication with you to the test.     ___ TAKE MORNING MEDICATIONS WITH A COUPLE SIPS OF WATER PRIOR TO TEST.     Hold all vitamins and supplements on the morning of your test.     HOLD THE FOLLOWING MEDICATIONS AS INDICATED BELOW:             WHAT TO DO BETWEEN THE FIRST TWO THALLIUM SCANS:  1. No strenuous exercise should be performed during this time.  2. A light lunch is permissible. The technologist will give you a list of appropriate foods.  3. Please return 15 minutes prior to the schedule of your second scan. Our nuclear technologist will tell you exactly what time to return.  4. Please do not use tobacco products in between scans.  5. After the first scan is completed, you may resume usual medications.     TEST FINDINGS:  You will receive the results of the test within 7 business days of its completion by telephone, unless arranged differently at the time of the procedure.  If you have any questions concerning your thallium test or if you do not hear from your [facility] physician/or nurse within 7 business days, please call the appropriate office checked above.                Follow up as directed.  Call sooner if issues.  Call the [place] nursing line at [phone].  Leave a detailed message for the nurse in [place] with how we can assist you and we will call you back.

## 2021-08-15 ENCOUNTER — Ambulatory Visit: Admit: 2021-08-15 | Discharge: 2021-08-15 | Payer: Commercial Managed Care - PPO

## 2021-08-15 ENCOUNTER — Encounter: Admit: 2021-08-15 | Discharge: 2021-08-15 | Payer: Commercial Managed Care - PPO

## 2021-08-15 DIAGNOSIS — R079 Chest pain, unspecified: Secondary | ICD-10-CM

## 2021-08-15 DIAGNOSIS — E785 Hyperlipidemia, unspecified: Secondary | ICD-10-CM

## 2021-08-15 DIAGNOSIS — R0609 Other forms of dyspnea: Secondary | ICD-10-CM

## 2021-08-15 DIAGNOSIS — Z136 Encounter for screening for cardiovascular disorders: Secondary | ICD-10-CM

## 2021-08-26 ENCOUNTER — Encounter: Admit: 2021-08-26 | Discharge: 2021-08-26 | Payer: Commercial Managed Care - PPO

## 2021-08-26 NOTE — Telephone Encounter
-----   Message from Altamease Oiler, MD sent at 08/26/2021  8:20 AM CDT -----  Would you guys mind letting Joshua Vasquez know that his echocardiogram looked good.  Normal heart pump function.  No significant valvular disease.  Stress test also looked good.  Nothing to suggest blockages in his heart arteries.  Normal heart pump function.    Thanks!

## 2021-08-26 NOTE — Telephone Encounter
Results and recommendations called to patient.

## 2021-12-09 ENCOUNTER — Encounter: Admit: 2021-12-09 | Discharge: 2021-12-09 | Payer: Commercial Managed Care - PPO

## 2022-04-14 ENCOUNTER — Encounter: Admit: 2022-04-14 | Discharge: 2022-04-14 | Payer: Commercial Managed Care - PPO

## 2022-04-14 NOTE — Telephone Encounter
Spoke with patient regarding verbal referral from Dr. Mallory Shirk office [facility]). Patient reports having total hip operation and is having neurological "issues" from this and has been referred by previous surgeon Dr. Aundria Rud. Request sent to Dr. Mallory Shirk nurse via VM; contact information provided (fax and phone) for referral. Patient will schedule new patient appointment once this is received.     Fax [phone] [facility]

## 2022-04-15 ENCOUNTER — Encounter: Admit: 2022-04-15 | Discharge: 2022-04-15 | Payer: Commercial Managed Care - PPO

## 2022-04-23 ENCOUNTER — Encounter: Admit: 2022-04-23 | Discharge: 2022-04-23 | Payer: Commercial Managed Care - PPO

## 2022-04-24 ENCOUNTER — Encounter: Admit: 2022-04-24 | Discharge: 2022-04-24 | Payer: Commercial Managed Care - PPO

## 2022-04-24 ENCOUNTER — Ambulatory Visit: Admit: 2022-04-24 | Discharge: 2022-04-24 | Payer: Commercial Managed Care - PPO

## 2022-04-24 DIAGNOSIS — R200 Anesthesia of skin: Secondary | ICD-10-CM

## 2022-04-24 DIAGNOSIS — R29898 Other symptoms and signs involving the musculoskeletal system: Secondary | ICD-10-CM

## 2022-04-24 MED ORDER — MEXILETINE 150 MG PO CAP
ORAL_CAPSULE | 5 refills | Status: AC
Start: 2022-04-24 — End: ?

## 2022-04-24 NOTE — Progress Notes
Date of Service: 04/24/2022     Subjective:               Joshua Vasquez. is a 61 y.o. male.        History of Present Illness    61 yo M referred by Dr. Jackquline Berlin for neurologic opinion of right foot drop.  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      EMG/NCS March 04, 2022: Done by Dr. Magnus Ivan neurology outside facility-impression was right deep peroneal neuropathy.  No reported evidence of neuropathy, lumbosacral radiculopathy or myopathy.    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    Postoperatively in December, in hospital at [facility] he slept on the right hip (night after surgery) which was very uncomfortable for him. He estimates he was put onto right side 6-8 hours after surgery.  He was in and out and does not rememeber if he had  symptoms before being rolled onto right side.  He does not remember having or not during the night.  He first noticed the symptoms the following morning with tingling in the foot.  Touching the whole foot was extremely tender.  Right foot was asleep with a foot drop (when got out of bed next morning to walk).    Currently, he has numbness from right midshin to the toes.  Bottoms of both feet burn from neuropathy.  .Entire right foot (dorsal/plantar) is numb.  There is a little numbness/tingling in posterior leg but is worse in lateral leg below the knee.  No numbness above the right knee.  Some tingling occasionally in the lower right hamstring.      He has right leg weakness.  There is right foot drop.  It is better with PT but was hanging to the floor.  He has no weakness lifting the right leg.  Balance has been ?not that good.  He uses a cane for support.  He has hamstrings cramps sometimes.      Overall, symptoms have not improved over the last two months after prior improvement.      No similar left leg symptoms other than neuropathy in the feet. It feels like the is tape stuck to them.  Numbness in the left involves the plantar left foot.  There is burning in the foot.  Burning and tingling in the right foot is prominent.    He takes Lyrica 300 BID and Nortriptyline 25 mg Hs.  He tried Gabapentin for years.  He had bad reaction to Cymbalta.                 Review of Systems   Constitutional: Positive for activity change, diaphoresis and fatigue.   HENT: Positive for hearing loss and tinnitus.    Eyes: Negative.    Respiratory: Positive for apnea.    Cardiovascular: Negative.    Gastrointestinal: Negative.    Endocrine: Negative.    Genitourinary: Negative.    Musculoskeletal: Positive for arthralgias, back pain, myalgias, neck pain and neck stiffness.   Skin: Negative.    Allergic/Immunologic: Positive for environmental allergies.   Neurological: Positive for tremors, weakness, numbness and headaches.   Hematological: Negative.    Psychiatric/Behavioral: Positive for sleep disturbance. The patient is nervous/anxious.        Chief Complaint:  Chief Complaint   Patient presents with   ? New Patient     Foot Drop; Rt  foot weakness; rolled ankle 2 days ago; wears a brace on Rt leg       Past Medical History:  Medical History:   Diagnosis Date   ? Acid reflux    ? Hyperlipemia 07/09/2010   ? Hypothyroidism    ? Lower back pain 07/09/2010   ? OSA on CPAP     doesnt use currently, cannot tolerate   ? RLS (restless legs syndrome)        Surgical History:  Surgical History:   Procedure Laterality Date   ? HX BACK SURGERY  1995    L 3-5   ? REINSERTION RUPTURED BICEPS/ TRICEPS TENDON WITH/ WITHOUT TENDON GRAFT - DISTAL Left 11/12/2018    Performed by Jerline Pain, MD at [facility]   ? ELBOW SURGERY Right     x2   ? HAND SURGERY Left     thumb   ? HX CARPAL TUNNEL RELEASE Right    ? HX TONSILLECTOMY     ? HX VASECTOMY     ? KNEE ARTHROSCOPY Right    ? KNEE ARTHROSCOPY Left        Social History:   Social History     Socioeconomic History   ? Marital status: Married Tobacco Use   ? Smoking status: Former     Packs/day: 1.50     Years: 6.00     Pack years: 9.00     Types: Cigarettes     Start date: 12/01/1973     Quit date: 12/02/1979     Years since quitting: 42.4   ? Smokeless tobacco: Former     Types: Chew     Quit date: 12/01/2012   Substance and Sexual Activity   ? Alcohol use: Not Currently   ? Drug use: Not Currently     Types: Marijuana     Comment: ointment daily             Family History:  Family History   Problem Relation Age of Onset   ? High Cholesterol Mother    ? Stroke Mother    ? Hyperlipidemia Mother    ? Arthritis-osteo Mother    ? Coronary Artery Disease Father    ? Arthritis-osteo Father    ? High Cholesterol Sister    ? Cancer Sister         leukemia   ? High Cholesterol Brother    ? Hyperlipidemia Brother    ? Cancer Maternal Grandmother         lung       Allergies:  Allergies   Allergen Reactions   ? Latex, Natural Rubber BLISTERS   ? Lortab [Hydrocodone-Acetaminophen] ITCHING     Just the Lortab brand he received caused itching, tolerates hydrocodone generics and APAP    ? Seasonal Allergies HEADACHE       Objective:         ? acetaminophen (TYLENOL EXTRA STRENGTH) 500 mg tablet Take two tablets by mouth every 4-6 hours as needed for Pain. Max of 4,000 mg of acetaminophen in 24 hours.   ? albuterol sulfate (PROAIR HFA) 90 mcg/actuation HFA aerosol inhaler Inhale two puffs by mouth into the lungs every 4-6 hours as needed for Wheezing or Shortness of Breath. Shake well before use.   ? ALPRAZolam (XANAX) 0.5 mg tablet Take one tablet by mouth daily as needed.   ? cetirizine (ZYRTEC) 10 mg tablet Take one tablet by mouth daily.   ? ezetimibe (ZETIA) 10 mg  tablet Take one tablet by mouth daily.   ? garlic (GARLIQUE PO) Take  by mouth daily.   ? ibuprofen (MOTRIN) 400 mg tablet Take one tablet by mouth four times daily as needed for Pain. Take with food.   ? krill-om-3-dha-epa-phospho-ast 500-115-30-64 mg cap Take 1 tablet by mouth daily.   ? levothyroxine (SYNTHROID) 75 mcg tablet Take one tablet by mouth daily 30 minutes before breakfast.   ? lidocaine (LIDODERM) 5 % topical patch Apply two patches topically to affected area as Needed. Apply patch for 12 hours, then remove for 12 hours before repeating.   ? metaxalone (SKELAXIN) 800 mg tablet Take one tablet by mouth three times daily.   ? MULTIVITAMIN PO Take 1 tablet by mouth daily.   ? nortriptyline (PAMELOR) 25 mg capsule Take one capsule by mouth at bedtime daily.   ? omeprazole DR (PRILOSEC) 20 mg capsule Take one capsule by mouth twice daily.   ? oxycodone HCl (OXYCODONE PO) Take 30 mg by mouth four times daily.   ? pregabalin (LYRICA) 300 mg capsule Take one capsule by mouth twice daily.   ? SENNA PLUS 8.6-50 mg tablet Take one tablet by mouth twice daily.   ? tamsulosin (FLOMAX) 0.4 mg capsule Take two capsules by mouth at bedtime daily. Do not crush, chew or open capsules. Take 30 minutes following the same meal each day.   ? TURMERIC ROOT EXTRACT PO Take 1 tablet by mouth daily.   ? ubidecarenone (COQ-10 PO) Take  by mouth daily.     Vitals:    04/24/22 0936   BP: 133/84   BP Source: Arm, Right Upper   Pulse: 98   Temp: 36.4 ?C (97.5 ?F)   SpO2: 97%   TempSrc: Oral   PainSc: Zero   Weight: 87.5 kg (193 lb)   Height: 177.8 cm (5' 10)     Body mass index is 27.69 kg/m?Marland Kitchen       Physical Exam    General: WG/WN/WD, ASA, attention appropriate, calm, cooperative demeanor, appropriately follows commands  HEENT: NC/AT  Cardiac: RRR without murmur  Neck: supple, full ROM    Fundoscopy: clear, sharp disc margins, no papilledema OU  Speech: fluent, no dysarthria or aphasia  Mental status: Alert, oriented x 4, NAD    CN: VFF without cut/hemianopsia, PERRL, EOMI without restriction or nystagmus, facial sensation symmetric (V1-V3) to LT bilaterally, No facial droop or ptosis, hearing equal to FR, palatal rise symmetric,  shoulder shrug symmetric, tongue midline    Motor: appendicular tone normal, no pronator drift of upper extremities  Strength:   UEs: 5/5 SA/EF/EE/WE/WF/FA  LEs: 4/5 HF 4/5 KE, 4/5 KF, 1/5 AEV, 2/5 ADF, 4+/5 AIV  No involuntary movements, no tremor  Sensation: PP decreased below left midshin, decreased near globally RLE below knee (superficial peroneal, saphenous, medial/lateral plantar distribution), vibration 3 sec both GTs    DTRs: 2/2 in BR/B 1/2P 1/1A, downgoing plantar reflexes bilaterally  Right foot drop    Coordination: normal bilateral FTN without dysmetria  Gait: cautious, antalgic, limp, right foot drop with steppage gait, unable to stand heels/toes, sensory ataxia of tandem         Assessment and Plan:    1. Right lower extremity weakness/numbness/neuralgia s/p right hip arthroplasty  2. 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 Examination features do not support EMG impression of an isolated deep peroneal neuropathy.  Weakness/numbness NOT confined to peroneal distribution.  Weakness involves right hip flexion/knee  extension (femoral), knee flexion (sciatic), ankle inversion (tibial), dorsiflexion/eversion (peroneal).  Numbness involves peroneal, saphenous, medial/lateral plantar and ?femoral (above knee).  This pattern suggests a more proximal lesion (lumbosacral plexus or multiroot).  This could make sense given onset after hip surgery.  2. Repeat EMG/NCS BLE w femoral studies  3. Start Mexiletine 150 mgqdx5d then 300 mg every day  4. Order 12 lead EKG - do not start Mexiletine UNTIL EKG done and he calls for approval to begin  5. Continue Lyrica 300 BID  6. Continue Nortriptyline 25 HS - did not tolerate higher dose  7. RTC 4 months.  Continue with right AFO and cane.  Complete PT already.

## 2022-07-11 ENCOUNTER — Encounter: Admit: 2022-07-11 | Discharge: 2022-07-11 | Payer: Commercial Managed Care - PPO

## 2022-07-15 ENCOUNTER — Encounter: Admit: 2022-07-15 | Discharge: 2022-07-15 | Payer: Commercial Managed Care - PPO

## 2022-07-15 ENCOUNTER — Ambulatory Visit: Admit: 2022-07-15 | Discharge: 2022-07-15 | Payer: Commercial Managed Care - PPO

## 2022-07-15 DIAGNOSIS — R29898 Other symptoms and signs involving the musculoskeletal system: Secondary | ICD-10-CM

## 2022-07-15 DIAGNOSIS — G2581 Restless legs syndrome: Secondary | ICD-10-CM

## 2022-07-15 DIAGNOSIS — R200 Anesthesia of skin: Secondary | ICD-10-CM

## 2022-07-15 DIAGNOSIS — G4733 Obstructive sleep apnea (adult) (pediatric): Secondary | ICD-10-CM

## 2022-07-15 DIAGNOSIS — K219 Gastro-esophageal reflux disease without esophagitis: Secondary | ICD-10-CM

## 2022-07-15 DIAGNOSIS — M545 Lower back pain: Secondary | ICD-10-CM

## 2022-07-15 DIAGNOSIS — E039 Hypothyroidism, unspecified: Secondary | ICD-10-CM

## 2022-07-15 DIAGNOSIS — E785 Hyperlipidemia, unspecified: Secondary | ICD-10-CM

## 2022-07-15 NOTE — Procedures
Please refer to scanned EMG/NCS report for study results.  No separate note otherwise placed for procedure.      Patient well tolerated the procedure and was sent home with no complications.  Time out was taken today to confirm patient identity.

## 2022-07-22 ENCOUNTER — Encounter: Admit: 2022-07-22 | Discharge: 2022-07-22 | Payer: Commercial Managed Care - PPO

## 2022-07-22 NOTE — Telephone Encounter
-----   Message from Apolinar Junes., DO sent at 07/21/2022  1:32 PM CDT -----  Regarding: RE: Meds  Notify patient.  OK to start Mexiletine as prescribed.  EKG parameter looks fine.  ----- Message -----  From: Carmell Austria, RN  Sent: 07/21/2022  12:22 PM CDT  To: Apolinar Junes., DO  Subject: Meds                                             Hi Reita Cliche called and said he wasn't supposed to start his medication until you had reviewed the results of EKG    Plan:  1. Examination features do not support EMG impression of an isolated deep peroneal neuropathy.  Weakness/numbness NOT confined to peroneal distribution.  Weakness involves right hip flexion/knee extension (femoral), knee flexion (sciatic), ankle inversion (tibial), dorsiflexion/eversion (peroneal).  Numbness involves peroneal, saphenous, medial/lateral plantar and ?femoral (above knee).  This pattern suggests a more proximal lesion (lumbosacral plexus or multiroot).  This could make sense given onset after hip surgery.  2. Repeat EMG/NCS BLE w femoral studies  3. Start Mexiletine 150 mgqdx5d then 300 mg every day  4. Order 12 lead EKG - do not start Mexiletine UNTIL EKG done and he calls for approval to begin  5. Continue Lyrica 300 BID  6. Continue Nortriptyline 25 HS - did not tolerate higher dose  7. RTC 4 months.  Continue with right AFO and cane.  Complete PT already

## 2022-09-19 ENCOUNTER — Encounter: Admit: 2022-09-19 | Discharge: 2022-09-19 | Payer: Commercial Managed Care - PPO

## 2022-11-05 ENCOUNTER — Encounter: Admit: 2022-11-05 | Discharge: 2022-11-05 | Payer: Commercial Managed Care - PPO

## 2022-11-18 ENCOUNTER — Encounter: Admit: 2022-11-18 | Discharge: 2022-11-18 | Payer: Commercial Managed Care - PPO

## 2022-11-18 ENCOUNTER — Ambulatory Visit: Admit: 2022-11-18 | Discharge: 2022-11-18 | Payer: Commercial Managed Care - PPO

## 2022-11-18 DIAGNOSIS — R29898 Other symptoms and signs involving the musculoskeletal system: Secondary | ICD-10-CM

## 2022-11-18 DIAGNOSIS — R202 Paresthesia of skin: Secondary | ICD-10-CM

## 2022-11-18 NOTE — Patient Instructions
Preparing for your exam:    Please shower/bathe prior to your exam.  Do not apply any creams, lotions or oils to your skin after showering/bathing.    Eat a normal meal prior to your exam.    Time your pain medication so that you can take a dose prior to your exam.    After the exam, you can resume all normal activities including driving.    Please notify our office if you have a pacemaker or other implanted device.    Please notify the doctor performing the test if you are taking Coumadin (warfarin), Plavix, Aspirin or blood thinning medications, but do not stop any of these medications prior to the study unless specifically instructed.     While a family, friend or caregiver may accompany you to the exam; they will not be permitted in the EMG room during testing.    What you should know about your EMG and NCS:  Why are you having an EMG/NCS?   Having an EMG/NCS performed can give further explanation to your doctor as to why you are experiencing; numbness, tingling, weakness or muscle cramping.  The doctor performing your exam will determine which test to do.    What does NCS show?  NCS shows how well electrical signals are traveling to a nerve.  This is done by applying small electrical shocks to the nerve and recording how the nerve works.  The shocks cause a quick, mild tingling feeling.  The doctor may test several nerves.    What happens during a needle EMG?  A small, thin needle is put in several muscles to see if there are any problems. There may be a small amount of pain when the needle is placed.  A new needle is used with every patient.  The doctor will only test the muscles necessary to decide what is wrong.  The doctor will look & listen to the electronic signals traveling from the needle to the EMG machine.  The doctor will then evaluate the results & report the findings to your referring doctor.    How long will the EMG/NCS take? When do I get my results?  Plan roughly 45-60 min. for the exam.  There are no lasting side effects from these exams.  Your referring doctor will receive the test results.  Therefore, you will be contacting his/hers office to discuss the results & your plan of care.    The doctor will answer any additional questions you may have at the time of the exam.  Thank you!

## 2022-11-18 NOTE — Progress Notes
Date of Service: 11/18/2022     Subjective:                 History of Present Illness    Last visit was July 15, 2022 for EMG/NCS of both lower extremities.  It was abnormal suggestive of right lower lumbar radiculopathy.  Superimposed distal not entrapment deep peroneal neuropathy cannot be excluded.  Poor CMAP response at the EDB but normal measuring at the TA unexpected due to lumbar radiculopathy.  No plexopathy, nerve entrapment, polyneuropathy or other abnormal process    His last clinical visit with me was in May 2023 when he presented for right lower extremity numbness and weakness and bilateral foot numbness.  I thought that examination features then did not fit isolated deep peroneal neuropathy because weakness and numbness were not confined to peroneal distribution.  He had weakness in right hip flexion, knee extension, knee flexion, ankle inversion and dorsiflexion/eversion.  Numbness was in the peroneal, saphenous, medial/lateral plantar and possible femoral distribution.  More proximal lesion was suspected with either plexus or multi root involvement.    EKG August 2023 PR interval 154 ms.  I started mexiletine and titrated to 300 mg daily for neuropathic pain.  Continued Lyrica 300 mg twice daily and nortriptyline 25 mg at bedtime (did not tolerate higher dose)    Foot/leg symptoms are better.  Strength is unchanged but wears AFO for support.  Mexiletine helped with nerve pain. Foot ad lateral leg numbness still present.  There is occasional burn and sting on top of foot.  He denies numbness or weakness above the knee now.  He still has discomfort in the bottom of both feet.      He did not tolerate Cymbalta or higher dose of Nortriptyline.      He still has burning in the bottom of both feet.  He is able to walk with less pain though.  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  He has trouble with balance though and has fallen especially in tall grass.      He did PT before (5/23) wo much benefit.  None more recently.      He has had no new concerns.  He says he is much better now than I was before.         Chief Complaint:  Chief Complaint   Patient presents with    Follow Up     RLE Weakness,numbness, Neuralgia       Past Medical History:  Medical History:   Diagnosis Date    Acid reflux     Hyperlipemia 07/09/2010    Hypothyroidism     Lower back pain 07/09/2010    OSA on CPAP     doesnt use currently, cannot tolerate    RLS (restless legs syndrome)        Surgical History:  Surgical History:   Procedure Laterality Date    HX BACK SURGERY  1995    L 3-5    REINSERTION RUPTURED BICEPS/ TRICEPS TENDON WITH/ WITHOUT TENDON GRAFT - DISTAL Left 11/12/2018    Performed by Jerline Pain, MD at [facility]    ELBOW SURGERY Right     x2    HAND SURGERY Left     thumb    HX CARPAL TUNNEL RELEASE Right     HX TONSILLECTOMY      HX VASECTOMY      KNEE ARTHROSCOPY Right  KNEE ARTHROSCOPY Left        Social History:   Social History     Socioeconomic History    Marital status: Married   Tobacco Use    Smoking status: Former     Packs/day: 1.50     Years: 6.00     Additional pack years: 0.00     Total pack years: 9.00     Types: Cigarettes     Start date: 12/01/1973     Quit date: 12/02/1979     Years since quitting: 42.9    Smokeless tobacco: Former     Types: Chew     Quit date: 12/01/2012   Substance and Sexual Activity    Alcohol use: Not Currently    Drug use: Not Currently     Types: Marijuana     Comment: ointment daily             Family History:  Family History   Problem Relation Age of Onset    High Cholesterol Mother     Stroke Mother     Hyperlipidemia Mother     Arthritis-osteo Mother     Coronary Artery Disease Father     Arthritis-osteo Father     High Cholesterol Sister     Cancer Sister         leukemia    High Cholesterol Brother     Hyperlipidemia Brother     Cancer Maternal Grandmother         lung       Allergies:  Allergies   Allergen Reactions    Latex, Natural Rubber BLISTERS Lortab [Hydrocodone-Acetaminophen] ITCHING     Just the Lortab brand he received caused itching, tolerates hydrocodone generics and APAP     Seasonal Allergies HEADACHE       Objective:          acetaminophen (TYLENOL EXTRA STRENGTH) 500 mg tablet Take two tablets by mouth every 4-6 hours as needed for Pain. Max of 4,000 mg of acetaminophen in 24 hours.    albuterol sulfate (PROAIR HFA) 90 mcg/actuation HFA aerosol inhaler Inhale two puffs by mouth into the lungs every 4-6 hours as needed for Wheezing or Shortness of Breath. Shake well before use.    ALPRAZolam (XANAX) 0.5 mg tablet Take one tablet by mouth daily as needed.    cetirizine (ZYRTEC) 10 mg tablet Take one tablet by mouth daily.    ezetimibe (ZETIA) 10 mg tablet Take one tablet by mouth daily.    garlic (GARLIQUE PO) Take  by mouth daily.    ibuprofen (MOTRIN) 400 mg tablet Take one tablet by mouth four times daily as needed for Pain. Take with food.    krill-om-3-dha-epa-phospho-ast 500-115-30-64 mg cap Take 1 tablet by mouth daily.    levothyroxine (SYNTHROID) 75 mcg tablet Take one tablet by mouth daily 30 minutes before breakfast.    lidocaine (LIDODERM) 5 % topical patch Apply two patches topically to affected area as Needed. Apply patch for 12 hours, then remove for 12 hours before repeating.    metaxalone (SKELAXIN) 800 mg tablet Take one tablet by mouth three times daily.    mexiletine (MEXITIL) 150 mg capsule Take 1 cap daily for 5d then 2 caps daily.  Do not start until you call office for authorization after EKG.    MULTIVITAMIN PO Take 1 tablet by mouth daily.    nortriptyline (PAMELOR) 25 mg capsule Take one capsule by mouth at bedtime daily.    omeprazole  DR (PRILOSEC) 20 mg capsule Take one capsule by mouth twice daily.    oxycodone HCl (OXYCODONE PO) Take 30 mg by mouth four times daily.    pregabalin (LYRICA) 300 mg capsule Take one capsule by mouth twice daily.    SENNA PLUS 8.6-50 mg tablet Take one tablet by mouth twice daily. tamsulosin (FLOMAX) 0.4 mg capsule Take two capsules by mouth at bedtime daily. Do not crush, chew or open capsules. Take 30 minutes following the same meal each day.    TURMERIC ROOT EXTRACT PO Take 1 tablet by mouth daily.    ubidecarenone (COQ-10 PO) Take  by mouth daily.     Vitals:    11/18/22 1204   BP: (!) 146/88   BP Source: Arm, Right Upper   Pulse: 88   Temp: 36.6 ?C (97.8 ?F)   SpO2: 97%   TempSrc: Oral   PainSc: Four   Weight: 86.2 kg (190 lb)   Height: 177.8 cm (5' 10)     Body mass index is 27.26 kg/m?Marland Kitchen       Physical Exam    General: WG/WN/WD, ASA, attention appropriate, calm, cooperative demeanor, appropriately follows commands  HEENT: NC/AT  Cardiac: RRR without murmur  Neck: supple, full ROM     LEs:      4/5 HF 4+/5 KE, 3-/5 AEV, 3/5 ADF, 4+/5 AIV, GTE 2/5  Sensation: PP decreased below right midshin (medial/lateral leg, dorsal/plantar foot), decreased plantar left foot   DTRs: 2/2 in BR/B 1/2P 0/0A, downgoing plantar reflexes bilaterally  Right foot drop, has right AFO  Gait: cautious, antalgic, limp, right foot drop with steppage gait       Assessment and Plan:    RLE weakness - ?Lumbosacral plexopathy vs lumbar radiculopathy  Paresthesia bot feet - no EMG/NCS evidence large fiber PN  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  Exam suggestive of proximal lesion (lumbosacral plexus vs multiroot).  EMG showed low lumbar radiculopathy but does not explain all findings on exam (absent patella reflex, numbness in saphenous territory).  Isolated peroneal neuropathy does not explain constellation of symptoms/findings BUT right peroneal innervated muscles disproportionately weak.  Repeat EMG/NCS BLE with femoral studies - I am suspcious of plexopathy (not seen on study from 8/23)  Order ENFD - r/o small fiber PN - no large fiber PN on exam  Order MRI Lspine wo GAD  MRI lumbar/sacral plexus wo GAD  Refer to Rehab medicine and PT - maximize mobilty/strength  Guarded neurologic prognosis.  Has been year since hip surgery and development of symptoms.  Usually 12-18 months post nerve injury is time period for maximal recovery.  Rule out structural lesions in hip/low back but I suspect will have chronic and permanent deficit.  RTC 6 months or prn sooner.  Continue current meds (Lyrica, Nortriptyline and Mexiletine)    Total Time Today was 45 minutes in the following activities: Preparing to see the patient, Obtaining and/or reviewing separately obtained history, Performing a medically appropriate examination and/or evaluation, Counseling and educating the patient/family/caregiver, Ordering medications, tests, or procedures, and Documenting clinical information in the electronic or other health record

## 2022-11-19 ENCOUNTER — Encounter: Admit: 2022-11-19 | Discharge: 2022-11-19 | Payer: Commercial Managed Care - PPO

## 2022-11-27 ENCOUNTER — Encounter: Admit: 2022-11-27 | Discharge: 2022-11-27 | Payer: Commercial Managed Care - PPO

## 2022-11-27 NOTE — Progress Notes
No PA required for CPT codes 61443 and 11105 per [facility] Reference # [id number]

## 2022-12-04 ENCOUNTER — Encounter: Admit: 2022-12-04 | Discharge: 2022-12-04 | Payer: Commercial Managed Care - PPO

## 2022-12-08 ENCOUNTER — Encounter: Admit: 2022-12-08 | Discharge: 2022-12-08 | Payer: Commercial Managed Care - PPO

## 2022-12-08 NOTE — Telephone Encounter
Physical therapy plan of care signed by Dr. Alric Ran and faxed to [facility]  Successful fax report received

## 2022-12-09 ENCOUNTER — Encounter: Admit: 2022-12-09 | Discharge: 2022-12-09 | Payer: Commercial Managed Care - PPO

## 2022-12-17 ENCOUNTER — Encounter: Admit: 2022-12-17 | Discharge: 2022-12-17 | Payer: Commercial Managed Care - PPO

## 2022-12-17 NOTE — Telephone Encounter
Pt says he will come in early to fil out questionnaire

## 2022-12-18 ENCOUNTER — Ambulatory Visit: Admit: 2022-12-18 | Discharge: 2022-12-19 | Payer: Commercial Managed Care - PPO

## 2022-12-18 ENCOUNTER — Encounter: Admit: 2022-12-18 | Discharge: 2022-12-18 | Payer: Commercial Managed Care - PPO

## 2022-12-19 DIAGNOSIS — R202 Paresthesia of skin: Secondary | ICD-10-CM

## 2022-12-19 DIAGNOSIS — R29898 Other symptoms and signs involving the musculoskeletal system: Secondary | ICD-10-CM

## 2022-12-25 ENCOUNTER — Encounter: Admit: 2022-12-25 | Discharge: 2022-12-25 | Payer: Commercial Managed Care - PPO

## 2022-12-27 ENCOUNTER — Encounter: Admit: 2022-12-27 | Discharge: 2022-12-27 | Payer: Commercial Managed Care - HMO

## 2022-12-27 ENCOUNTER — Encounter: Admit: 2022-12-27 | Discharge: 2022-12-27 | Payer: Commercial Managed Care - PPO

## 2022-12-27 ENCOUNTER — Ambulatory Visit: Admit: 2022-12-27 | Discharge: 2022-12-27 | Payer: Commercial Managed Care - HMO

## 2022-12-27 DIAGNOSIS — R29898 Other symptoms and signs involving the musculoskeletal system: Secondary | ICD-10-CM

## 2022-12-27 DIAGNOSIS — R202 Paresthesia of skin: Secondary | ICD-10-CM

## 2023-01-05 ENCOUNTER — Encounter: Admit: 2023-01-05 | Discharge: 2023-01-05 | Payer: Commercial Managed Care - HMO

## 2023-01-05 DIAGNOSIS — R202 Paresthesia of skin: Secondary | ICD-10-CM

## 2023-01-07 ENCOUNTER — Encounter: Admit: 2023-01-07 | Discharge: 2023-01-07 | Payer: Commercial Managed Care - PPO

## 2023-01-07 ENCOUNTER — Encounter: Admit: 2023-01-07 | Discharge: 2023-01-07 | Payer: Commercial Managed Care - HMO

## 2023-01-07 ENCOUNTER — Ambulatory Visit: Admit: 2023-01-07 | Discharge: 2023-01-07 | Payer: Commercial Managed Care - HMO

## 2023-01-07 DIAGNOSIS — M545 Lower back pain: Secondary | ICD-10-CM

## 2023-01-07 DIAGNOSIS — G4733 Obstructive sleep apnea (adult) (pediatric): Secondary | ICD-10-CM

## 2023-01-07 DIAGNOSIS — K219 Gastro-esophageal reflux disease without esophagitis: Secondary | ICD-10-CM

## 2023-01-07 DIAGNOSIS — G2581 Restless legs syndrome: Secondary | ICD-10-CM

## 2023-01-07 DIAGNOSIS — E039 Hypothyroidism, unspecified: Secondary | ICD-10-CM

## 2023-01-07 DIAGNOSIS — R29898 Other symptoms and signs involving the musculoskeletal system: Secondary | ICD-10-CM

## 2023-01-07 DIAGNOSIS — E785 Hyperlipidemia, unspecified: Secondary | ICD-10-CM

## 2023-01-07 DIAGNOSIS — R202 Paresthesia of skin: Secondary | ICD-10-CM

## 2023-01-07 MED ORDER — LIDOCAINE-EPINEPHRINE 1 %-1:100,000 IJ SOLN
2 mL | Freq: Once | INTRAMUSCULAR | 0 refills | Status: AC
Start: 2023-01-07 — End: ?

## 2023-01-07 MED ORDER — NEOMYCIN-BACITRACIN-POLYMYXIN TP PACKET GROUP
Freq: Once | TOPICAL | 0 refills | Status: AC
Start: 2023-01-07 — End: ?

## 2023-01-07 NOTE — Procedures
SKIN BIOPSY PROCEDURE NOTE     RE: Joshua Vasquez.  DOB:  08/27/1961  [facility]#: [id number]      DATE OF PROCEDURE:  01/07/2023  PERFORMED BY : Bing Quarry, MD  REFERRING PHYSICIAN: Tandy Gaw, DO  REASON FOR PROCEDURE:  Evaluation for small fiber neuropathy.    HISTORY OF PRESENT ILLNESS:  Patient reports of numbness from knee down since hip replacement.He also reports of drop foot. He had surgery in december 21st, 202. Patient denies any allergies to anesthetics or adhesive substances.     VITAL SIGNS: stable   Vitals:    01/07/23 1325   BP: 107/74   Pulse: 103     Procedure Time Out Check List:  Prior to the start of the procedure, I personally confirmed the following:    Site Marking Verified: Yes  Patient Identity (name & date of birth): Yes  Procedure: Yes  Site: Yes  Body Part: Yes    The risks of the procedure, including infection, bleeding, and pain, were discussed with the patient.    Preop pain score was 3 and postop pain score was 3 in the foot, 0 at the sites of the biopsy .     PROCEDURE NOTE: Informed consent was obtained prior to procedure after the risks and benefits were explained to patient. The patient was positioned in left lateral position. Two biopsy sites (right distal leg and proximal thigh) were identified and cleaned with alcohol.  Skin biopsies were performed at the 2 sites under local anesthesia using 1% lidocaine. Two skin specimens were obtained and placed into Zamboni?s fixative and stored for further processing.  Bandage was applied over the biopsy sites after homeostasis was obtained.  The patient did not have any complications during or after the procedure or side effects from medications.The patient was discharged in stable condition and provided with care sheet.  Patient was recommended to follow up with Dr Sunday Corn as planned.    Total time spent with the patient is 30 minutes.  Majority of this time was spent on procedure itself and explanation of benefits and risks of the procedure.          Bing Quarry, M.D.  Professor  Department of Neurology

## 2023-01-07 NOTE — Telephone Encounter
Signed plan of care faxed to [place]   [phone]  Successful fax report received

## 2023-01-07 NOTE — Procedures
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  Improved from prior 07/2022 study.  Previous widespread denervation suggestive of lumbar radiculopathy has resolved.  No evidence for plexopathy.  Bilateral medial plantar responses were absent - consider bilateral tarsal tunnel syndrome vs small fiber polyneuropathy.     Patient will follow up with referring physician for further workup, treatment and instructions.  The procedure was well tolerated with no complications today.  Patient was discharged home in good condition.       Thank you for your referral.  A full EMG/NCS report will be scanned into [facility] for your reference.  If you have any further questions or comments, please call my office.         Rolanda Lundborg., DO  Diplomate American Board of Psychiatry and Neurology  Clinical Associate Professor of Neurology  Chief of [facility] Neurology Division  Department of Neurology

## 2023-01-26 ENCOUNTER — Encounter: Admit: 2023-01-26 | Discharge: 2023-01-26 | Payer: Commercial Managed Care - HMO

## 2023-01-26 MED ORDER — MEXILETINE 150 MG PO CAP
300 mg | ORAL_CAPSULE | Freq: Every day | ORAL | 5 refills | Status: AC
Start: 2023-01-26 — End: ?

## 2023-01-26 NOTE — Telephone Encounter
Last office visit: 04/24/22    Next office visit: 05/21/23    Per LOV note, plan:  1. Examination features do not support EMG impression of an isolated deep peroneal neuropathy.  Weakness/numbness NOT confined to peroneal distribution.  Weakness involves right hip flexion/knee extension (femoral), knee flexion (sciatic), ankle inversion (tibial), dorsiflexion/eversion (peroneal).  Numbness involves peroneal, saphenous, medial/lateral plantar and ?femoral (above knee).  This pattern suggests a more proximal lesion (lumbosacral plexus or multiroot).  This could make sense given onset after hip surgery.  2. Repeat EMG/NCS BLE w femoral studies  3. Start Mexiletine 150 mgqdx5d then 300 mg every day  4. Order 12 lead EKG - do not start Mexiletine UNTIL EKG done and he calls for approval to begin  5. Continue Lyrica 300 BID  6. Continue Nortriptyline 25 HS - did not tolerate higher dose  7. RTC 4 months.  Continue with right AFO and cane.  Complete PT already.    Approved Refill

## 2023-02-09 ENCOUNTER — Encounter: Admit: 2023-02-09 | Discharge: 2023-02-09 | Payer: Commercial Managed Care - HMO

## 2023-02-24 ENCOUNTER — Encounter: Admit: 2023-02-24 | Discharge: 2023-02-24 | Payer: Commercial Managed Care - HMO

## 2023-04-02 ENCOUNTER — Ambulatory Visit: Admit: 2023-04-02 | Discharge: 2023-04-03 | Payer: 59

## 2023-04-02 ENCOUNTER — Encounter: Admit: 2023-04-02 | Discharge: 2023-04-02 | Payer: 59

## 2023-04-02 DIAGNOSIS — M545 Lower back pain: Secondary | ICD-10-CM

## 2023-04-02 DIAGNOSIS — G4733 Obstructive sleep apnea (adult) (pediatric): Secondary | ICD-10-CM

## 2023-04-02 DIAGNOSIS — E785 Hyperlipidemia, unspecified: Secondary | ICD-10-CM

## 2023-04-02 DIAGNOSIS — E039 Hypothyroidism, unspecified: Secondary | ICD-10-CM

## 2023-04-02 DIAGNOSIS — G2581 Restless legs syndrome: Secondary | ICD-10-CM

## 2023-04-02 DIAGNOSIS — M5412 Radiculopathy, cervical region: Secondary | ICD-10-CM

## 2023-04-02 DIAGNOSIS — R29898 Other symptoms and signs involving the musculoskeletal system: Secondary | ICD-10-CM

## 2023-04-02 DIAGNOSIS — K219 Gastro-esophageal reflux disease without esophagitis: Secondary | ICD-10-CM

## 2023-04-02 DIAGNOSIS — M5416 Radiculopathy, lumbar region: Secondary | ICD-10-CM

## 2023-04-07 ENCOUNTER — Encounter: Admit: 2023-04-07 | Discharge: 2023-04-07 | Payer: 59

## 2023-04-07 NOTE — Telephone Encounter
[facility] called to get clinicals so they can get PA for MRI. RN sent to fax [phone]

## 2023-04-10 ENCOUNTER — Encounter: Admit: 2023-04-10 | Discharge: 2023-04-10 | Payer: 59

## 2023-04-10 NOTE — Telephone Encounter
Pt LVM asking if he would need a driver for upcoming procedure with Dr. Welton Flakes. Requested a return call.    This RN returned call. Pt notified that he will not need a driver for the upcoming epidural scheduled with Dr. Welton Flakes. Pt expressed understanding.

## 2023-04-13 ENCOUNTER — Encounter: Admit: 2023-04-13 | Discharge: 2023-04-13 | Payer: 59

## 2023-04-16 ENCOUNTER — Ambulatory Visit: Admit: 2023-04-16 | Discharge: 2023-04-16 | Payer: 59

## 2023-04-16 ENCOUNTER — Encounter: Admit: 2023-04-16 | Discharge: 2023-04-16 | Payer: 59

## 2023-04-16 DIAGNOSIS — E785 Hyperlipidemia, unspecified: Secondary | ICD-10-CM

## 2023-04-16 DIAGNOSIS — M5416 Radiculopathy, lumbar region: Secondary | ICD-10-CM

## 2023-04-16 DIAGNOSIS — E039 Hypothyroidism, unspecified: Secondary | ICD-10-CM

## 2023-04-16 DIAGNOSIS — M545 Lower back pain: Secondary | ICD-10-CM

## 2023-04-16 DIAGNOSIS — G2581 Restless legs syndrome: Secondary | ICD-10-CM

## 2023-04-16 DIAGNOSIS — G4733 Obstructive sleep apnea (adult) (pediatric): Secondary | ICD-10-CM

## 2023-04-16 DIAGNOSIS — K219 Gastro-esophageal reflux disease without esophagitis: Secondary | ICD-10-CM

## 2023-04-16 MED ORDER — IOHEXOL 240 MG IODINE/ML IV SOLN
1 mL | Freq: Once | EPIDURAL | 0 refills | Status: CP
Start: 2023-04-16 — End: ?
  Administered 2023-04-16: 19:00:00 1 mL via EPIDURAL

## 2023-04-16 MED ORDER — METHYLPREDNISOLONE ACETATE 80 MG/ML IJ SUSP
80 mg | Freq: Once | INTRAMUSCULAR | 0 refills | Status: CP
Start: 2023-04-16 — End: ?
  Administered 2023-04-16: 19:00:00 80 mg via INTRAMUSCULAR

## 2023-04-16 NOTE — Patient Instructions
Procedure Completed Today: Lumbar Transforaminal Steroid Injection    Important information following your procedure today: You may drive today    Pain relief may not be immediate. It is possible you may even experience an increase in pain during the first 24-48 hours followed by a gradual decrease of your pain.  Though the procedure is generally safe and complications are rare, we do ask that you be aware of any of the following:   Any swelling, persistent redness, new bleeding, or drainage from the site of the injection.  You should not experience a severe headache.  You should not run a fever over 101? F.  New onset of sharp, severe back & or neck pain.  New onset of upper or lower extremity numbness or weakness.  New difficulty controlling bowel or bladder function after the injection.  New shortness of breath.    If any of these occur, please call to report this occurrence to Dr. Khan at [phone]. If you are calling after 4:00 p.m., on weekends or holidays please call [phone] and ask to have the resident physician on call for the physician paged or go to your local emergency room.  You may experience soreness at the injection site. Ice can be applied at 20 minute intervals. Avoid application of direct heat, hot showers or hot tubs today.  Avoid strenuous activity today. You may resume your regular activities and exercise tomorrow.  Patients with diabetes may see an elevation in blood sugars for 7-10 days after the injection. It is important to pay close attention to your diet, check your blood sugars daily and report extreme elevations to the physician that treats your diabetes.  Patients taking a daily blood thinner can resume their regular dose this evening.  It is important that you take all medications ordered by your pain physician. Taking medication as ordered is an important part of your pain care plan. If you cannot continue the medication plan, please notify the physician.     Possible side effects to steroids that may occur:  Flushing or redness of the face  Irritability  Fluid retention  Change in women?s menses    The following medications were used: Lidocaine , Depomedrol, and Contrast Dye

## 2023-04-16 NOTE — Progress Notes

## 2023-04-22 ENCOUNTER — Encounter: Admit: 2023-04-22 | Discharge: 2023-04-22 | Payer: 59

## 2023-05-20 ENCOUNTER — Encounter: Admit: 2023-05-20 | Discharge: 2023-05-20 | Payer: 59

## 2023-05-21 ENCOUNTER — Encounter: Admit: 2023-05-21 | Discharge: 2023-05-21 | Payer: 59

## 2023-05-21 ENCOUNTER — Ambulatory Visit: Admit: 2023-05-21 | Discharge: 2023-05-22 | Payer: 59

## 2023-05-21 MED ORDER — DULOXETINE 60 MG PO CPDR
60 mg | ORAL_CAPSULE | Freq: Every day | ORAL | 5 refills | 60.00000 days | Status: AC
Start: 2023-05-21 — End: ?

## 2023-05-21 MED ORDER — DULOXETINE 30 MG PO CPDR
30 mg | ORAL_CAPSULE | Freq: Every day | ORAL | 0 refills | 60.00000 days | Status: AC
Start: 2023-05-21 — End: ?

## 2023-05-21 NOTE — Progress Notes
Date of Service: 05/21/2023     Subjective:                 History of Present Illness    RLE pain is worsening.  He sees Dr. Welton Flakes and has had lumbar injection.  It hurts to swing leg forward.  Feels weaker in the right leg.  Numbness persists from the knee downward to the top of the foot.      Had burning in the plantar left foot.  No weakness in the left leg.  Has burning in the right foot also.  They are on fire.      He uses a cane for support.  Has had no leg cramping.  No bowel or bladder complaints.      Pain in back is midline and travels to right hip down the anterior right thigh.  It is sharp and burns.  It causes leg to feel weak.  Severity of pain reaches 9/10.  It is worse with walking and bending/leaning forward.  Avoids twisting and lifting.      Denies other new symptoms.  No other complaints today.  Had TFESI in 5.2024 (ineffective).    Had right hip replaced in 2021.  Denies current foot drop.  Has pain in the anterior groin.      Past trials:  Oxycodone 30mg  QID   Nortprityline 25 (tried 50 but not well tolerated)  Pregabalin 300mg  BID   Ibuprofen   Mexilitine    EMG/NCS 2/24: Evidence of right nonentrapment peroneal neuropathy.  Improved from prior 07/2022 study.  Previous widespread denervation suggestive of lumbar radiculopathy resolved.  No evidence for plexopathy.  Bilateral medial plantar responses were absent - consider bilateral tarsal tunnel syndrome vs small fiber polyneuropathy.     MRI Lspine with lumbosacral plexus 12/2022:  Multilevel lumbar disc degeneration resulting in mild spinal stenosis at L3-4 and L4-5. Multilevel facet arthropathy ranging from mild to   Moderate..  No definite abnormality of the lumbar and sacral plexus.  No mass.          Review of Systems   Constitutional: Negative.    HENT: Negative.     Eyes: Negative.    Respiratory: Negative.     Cardiovascular: Negative.    Gastrointestinal: Negative.    Endocrine: Negative.    Genitourinary: Negative. Musculoskeletal: Negative.    Skin: Negative.    Allergic/Immunologic: Negative.    Neurological: Negative.    Hematological: Negative.    Psychiatric/Behavioral: Negative.         Chief Complaint:  Chief Complaint   Patient presents with    Follow Up     MRI Results       Past Medical History:  Past Medical History:   Diagnosis Date    Acid reflux     Hyperlipemia 07/09/2010    Hypothyroidism     Lower back pain 07/09/2010    OSA on CPAP     doesnt use currently, cannot tolerate    RLS (restless legs syndrome)        Surgical History:  Surgical History:   Procedure Laterality Date    HX BACK SURGERY  1995    L 3-5    REINSERTION RUPTURED BICEPS/ TRICEPS TENDON WITH/ WITHOUT TENDON GRAFT - DISTAL Left 11/12/2018    Performed by Jerline Pain, MD at [facility]    ELBOW SURGERY Right     x2    HAND SURGERY Left     thumb  HX CARPAL TUNNEL RELEASE Right     HX TONSILLECTOMY      HX VASECTOMY      KNEE ARTHROSCOPY Right     KNEE ARTHROSCOPY Left        Social History:   Social History     Socioeconomic History    Marital status: Married   Tobacco Use    Smoking status: Former     Current packs/day: 0.00     Average packs/day: 1.5 packs/day for 6.0 years (9.0 ttl pk-yrs)     Types: Cigarettes     Start date: 12/01/1973     Quit date: 12/02/1979     Years since quitting: 43.4     Passive exposure: Past    Smokeless tobacco: Former     Types: Chew     Quit date: 12/01/2012   Vaping Use    Vaping status: Never Used   Substance and Sexual Activity    Alcohol use: Not Currently    Drug use: Not Currently     Types: Marijuana     Comment: ointment daily             Family History:  Family History   Problem Relation Name Age of Onset    High Cholesterol Mother      Stroke Mother      Hyperlipidemia Mother      Arthritis-osteo Mother      Coronary Artery Disease Father      Arthritis-osteo Father      High Cholesterol Sister      Cancer Sister          leukemia    High Cholesterol Brother      Hyperlipidemia Brother      Cancer Maternal Grandmother          lung       Allergies:  Allergies   Allergen Reactions    Latex, Natural Rubber BLISTERS    Lortab [Hydrocodone-Acetaminophen] ITCHING     Just the Lortab brand he received caused itching, tolerates hydrocodone generics and APAP     Seasonal Allergies HEADACHE       Objective:          acetaminophen (TYLENOL EXTRA STRENGTH) 500 mg tablet Take two tablets by mouth every 4-6 hours as needed for Pain. Max of 4,000 mg of acetaminophen in 24 hours.    albuterol sulfate (PROAIR HFA) 90 mcg/actuation HFA aerosol inhaler Inhale two puffs by mouth into the lungs every 4-6 hours as needed for Wheezing or Shortness of Breath. Shake well before use.    ALPRAZolam (XANAX) 0.5 mg tablet Take one tablet by mouth daily as needed.    cetirizine (ZYRTEC) 10 mg tablet Take one tablet by mouth daily.    ezetimibe (ZETIA) 10 mg tablet Take one tablet by mouth daily.    garlic (GARLIQUE PO) Take  by mouth daily.    ibuprofen (MOTRIN) 400 mg tablet Take one tablet by mouth four times daily as needed for Pain. Take with food.    krill-om-3-dha-epa-phospho-ast 500-115-30-64 mg cap Take 1 tablet by mouth daily.    levothyroxine (SYNTHROID) 75 mcg tablet Take one tablet by mouth daily 30 minutes before breakfast.    lidocaine (LIDODERM) 5 % topical patch Apply two patches topically to affected area as Needed. Apply patch for 12 hours, then remove for 12 hours before repeating.    metaxalone (SKELAXIN) 800 mg tablet Take one tablet by mouth three times daily.    mexiletine (MEXITIL)  150 mg capsule Take two capsules by mouth daily. TAKE ONE CAPSULE BY MOUTH FOR 5 DAYS, THEN TAKE TWO CAPSULES EVERY DAY **CALL OFFICE BEFORE STARTING FOR AUTHORIZATION**    MULTIVITAMIN PO Take 1 tablet by mouth daily.    nortriptyline (PAMELOR) 25 mg capsule Take one capsule by mouth at bedtime daily.    omeprazole DR (PRILOSEC) 20 mg capsule Take one capsule by mouth twice daily.    oxycodone HCl (OXYCODONE PO) Take 30 mg by mouth four times daily.    pregabalin (LYRICA) 300 mg capsule Take one capsule by mouth twice daily.    SENNA PLUS 8.6-50 mg tablet Take one tablet by mouth twice daily.    tamsulosin (FLOMAX) 0.4 mg capsule Take two capsules by mouth at bedtime daily. Do not crush, chew or open capsules. Take 30 minutes following the same meal each day.    TURMERIC ROOT EXTRACT PO Take 1 tablet by mouth daily.    ubidecarenone (COQ-10 PO) Take  by mouth daily.     Vitals:    05/21/23 1333   BP: 105/72   Pulse: 94   SpO2: 97%   PainSc: Four   Weight: 88.5 kg (195 lb)   Height: 177.8 cm (5' 10)     Body mass index is 27.98 kg/m?Marland Kitchen       Physical Exam    General: WG/WN/WD, ASA, attention appropriate, calm, cooperative demeanor, appropriately follows commands  HEENT: NC/AT  Cardiac: RRR without murmur  Neck: supple, full ROM  UEs:    LEs:    4/5 HF 4/5 KE, 4/5 AEV, 4-/5 ADF, 4+/5 AIV, GTE 4-/5  Sensation: PP decreased plantar left foot, decreased right L4/L5/S1 territories   DTRs: 2/2 in BR/B 1/2+P 0/0A, downgoing plantar reflexes bilaterally  Right foot drop, has right AFO  Gait: cautious, antalgic, limp, right foot drop with steppage gait       Assessment and Plan:    Right leg pain/weakness  ?Right lumbar radiculoplexopathy - global RLE weakness/pain  Idiopathic small fiber polyneuropathy  Mechanical leg pain - hip replacement      DC Nortriptyline and Metaxalone  Start Cymbalta 30 mg qdx7d then 60 mg every day  Continue Lyrica 300 mg BID  Patient stopped PT - made pain worse  FU anesthesia pain mgmt  Refer to Rehab Medicine - improve mobility and ambulation  Continue Mexiletine  Consider spinal stimulator trial - pain mgmt  Vit B12, ESR, MMA, HIV, A1c, ANA  Symptomatic mgmt - no structural neurologic lesion found.  Imaging/Edx testing does not explain degree of pain/weakness.

## 2023-05-22 DIAGNOSIS — R29898 Other symptoms and signs involving the musculoskeletal system: Secondary | ICD-10-CM

## 2023-06-03 ENCOUNTER — Encounter: Admit: 2023-06-03 | Discharge: 2023-06-03 | Payer: 59

## 2023-06-12 ENCOUNTER — Encounter: Admit: 2023-06-12 | Discharge: 2023-06-12 | Payer: 59

## 2023-06-12 NOTE — Telephone Encounter
Patient called and left VM asking for a call back in regards to his NP appointment with Dr. Katrinka Blazing.   This RN called patient back to answer questions. Patient stated that he saw Dr. Chase Picket back in January and was advised there was nothing they could do for him and [facility] referred to Dr. Katrinka Blazing. Patient advised he drives 2 hours to make appts and wanted to make sure this wasn't a waste of anyone's time.   This RN advised I would look into this and get back to him once I found out an answer.

## 2023-06-12 NOTE — Telephone Encounter
Called patient back, advised Dr. Katrinka Blazing does procedures and different type of care from Dr. Chase Picket.   Patient asked for his appointment to be cancelled at this time. He sees Dr. Welton Flakes for his injections, etc and Dr. Chase Picket and Dr. Sunday Corn. He was not sure what Dr. Katrinka Blazing could offer to him. Patient was frustrated, and didn't want to keep appointment. RN cancelled this appointment at this time.

## 2023-06-18 ENCOUNTER — Encounter: Admit: 2023-06-18 | Discharge: 2023-06-18 | Payer: 59

## 2023-06-24 ENCOUNTER — Ambulatory Visit: Admit: 2023-06-24 | Discharge: 2023-06-25 | Payer: 59

## 2023-06-24 ENCOUNTER — Encounter: Admit: 2023-06-24 | Discharge: 2023-06-24 | Payer: 59

## 2023-06-24 DIAGNOSIS — G4733 Obstructive sleep apnea (adult) (pediatric): Secondary | ICD-10-CM

## 2023-06-24 DIAGNOSIS — E785 Hyperlipidemia, unspecified: Secondary | ICD-10-CM

## 2023-06-24 DIAGNOSIS — E039 Hypothyroidism, unspecified: Secondary | ICD-10-CM

## 2023-06-24 DIAGNOSIS — M545 Lower back pain: Secondary | ICD-10-CM

## 2023-06-24 DIAGNOSIS — G2581 Restless legs syndrome: Secondary | ICD-10-CM

## 2023-06-24 DIAGNOSIS — K219 Gastro-esophageal reflux disease without esophagitis: Secondary | ICD-10-CM

## 2023-06-24 DIAGNOSIS — M533 Sacrococcygeal disorders, not elsewhere classified: Secondary | ICD-10-CM

## 2023-06-24 NOTE — Progress Notes
[facility]INE [facility] CLINIC NOTE       SUBJECTIVE:   Chronic back pain and radiating right hip and anterior thigh pain   Thigh pain worse with activity, standing walking  Pain down the leg, numbness tingling below the knee   This is more constant regardless of activity   Pain limits function   Notices a shift or pop like sensation in the pelvis/lower back at times when he tries to stand or walk   Recent inj without relief L3/4     TRX   Right total hip replacement 2022  Right l3 and l4 TFESI 5/16 without relief     MEDS  Oxcodone   Lyrica   Lidocaine patach   Acetaminophen         Review of Systems    Current Outpatient Medications:     acetaminophen (TYLENOL EXTRA STRENGTH) 500 mg tablet, Take two tablets by mouth every 4-6 hours as needed for Pain. Max of 4,000 mg of acetaminophen in 24 hours., Disp: , Rfl:     albuterol sulfate (PROAIR HFA) 90 mcg/actuation HFA aerosol inhaler, Inhale two puffs by mouth into the lungs every 4-6 hours as needed for Wheezing or Shortness of Breath. Shake well before use., Disp: , Rfl:     ALPRAZolam (XANAX) 0.5 mg tablet, Take one tablet by mouth daily as needed., Disp: , Rfl:     cetirizine (ZYRTEC) 10 mg tablet, Take one tablet by mouth daily., Disp: , Rfl:     ezetimibe (ZETIA) 10 mg tablet, Take one tablet by mouth daily., Disp: , Rfl:     garlic (GARLIQUE PO), Take  by mouth daily., Disp: , Rfl:     ibuprofen (MOTRIN) 400 mg tablet, Take one tablet by mouth four times daily as needed for Pain. Take with food., Disp: , Rfl:     krill-om-3-dha-epa-phospho-ast 500-115-30-64 mg cap, Take 1 tablet by mouth daily., Disp: , Rfl:     levothyroxine (SYNTHROID) 75 mcg tablet, Take one tablet by mouth daily 30 minutes before breakfast., Disp: , Rfl:     lidocaine (LIDODERM) 5 % topical patch, Apply two patches topically to affected area as Needed. Apply patch for 12 hours, then remove for 12 hours before repeating., Disp: , Rfl:     mexiletine (MEXITIL) 150 mg capsule, Take two capsules by mouth daily. TAKE ONE CAPSULE BY MOUTH FOR 5 DAYS, THEN TAKE TWO CAPSULES EVERY DAY **CALL OFFICE BEFORE STARTING FOR AUTHORIZATION**, Disp: 60 capsule, Rfl: 5    MULTIVITAMIN PO, Take 1 tablet by mouth daily., Disp: , Rfl:     omeprazole DR (PRILOSEC) 20 mg capsule, Take one capsule by mouth twice daily., Disp: , Rfl:     oxycodone HCl (OXYCODONE PO), Take 30 mg by mouth four times daily., Disp: , Rfl:     pregabalin (LYRICA) 300 mg capsule, Take one capsule by mouth twice daily., Disp: 60 capsule, Rfl: 3    SENNA PLUS 8.6-50 mg tablet, Take one tablet by mouth twice daily., Disp: , Rfl:     tamsulosin (FLOMAX) 0.4 mg capsule, Take two capsules by mouth at bedtime daily. Do not crush, chew or open capsules. Take 30 minutes following the same meal each day., Disp: , Rfl:     TURMERIC ROOT EXTRACT PO, Take 1 tablet by mouth daily., Disp: , Rfl:     ubidecarenone (COQ-10 PO), Take  by mouth daily., Disp: , Rfl:   Allergies   Allergen Reactions    Latex, Natural Rubber BLISTERS  Lortab [Hydrocodone-Acetaminophen] ITCHING     Just the Lortab brand he received caused itching, tolerates hydrocodone generics and APAP     Seasonal Allergies HEADACHE     Physical Exam  Vitals:    06/24/23 1450   BP: 132/82   Pulse: 94   SpO2: 99%   PainSc: Four   Weight: 88.5 kg (195 lb)   Height: 177.8 cm (5' 10)     Oswestry Total Score:: 54  Pain Score: Four  Body mass index is 27.98 kg/m?Marland Kitchen  General: Alert, oriented, moderaate distress  HEENT: normocephalic  NECK: TTP along c spine and paracerv muscles. Limited ROM  Resp: Non labored breathing, no distress  Cardio: Pedal pulses palpable bilaterally, mild lower extremity edema  MS: TTP in lumbar region of spine and paraspinal muscles. SI joint TTP on right. + FABERS, + thigh thrust, + compression   NEURO: Cranial nerves II-XII intact. Motor strength adequate against gravity, sensory exams with def along l3 and 4 upper thigh.  Gait antalgic.   Behavior: Calm, cooperative, behavior and speech normal.         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 Lumbar radiculopathy        PLAN:     Plan SI joint inj right to eval and rule out this source   Reviewed spine imaging   Dose have lumbar stenosis l3 and 4   Suggest surgical referral to discuss corrective trx   Cont meds as prescribed   F.u after inj

## 2023-06-25 DIAGNOSIS — M5416 Radiculopathy, lumbar region: Secondary | ICD-10-CM

## 2023-07-14 ENCOUNTER — Encounter: Admit: 2023-07-14 | Discharge: 2023-07-14 | Payer: 59

## 2023-07-14 IMAGING — MR Hips^ROUTINE
5 series · 36 of 48 positions shown · non-contrast
Comparison: none

[Series 2: T1 fat-sat · axial · 6.0mm · 0.74mm/px · z∈[-91,-77]mm · 2 of 2 slices shown]
[im 1/2]
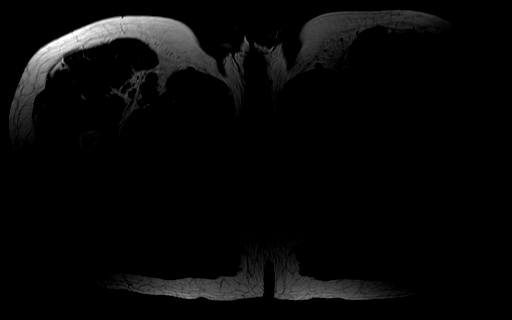
[im 2/2]
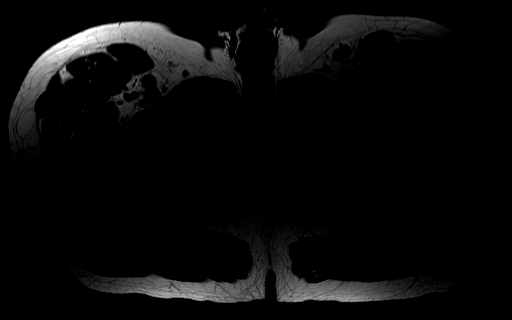

[Series 3: STIR · coronal · 5.0mm · 1.19mm/px · 8 of 24 slices shown]
[im 1/24]
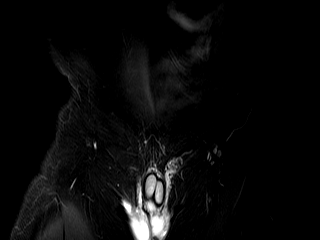
[im 4/24]
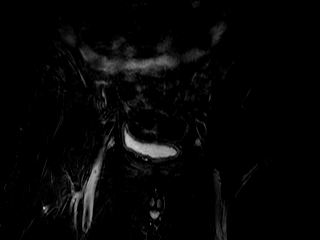
[im 8/24]
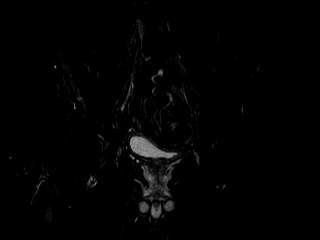
[im 10/24]
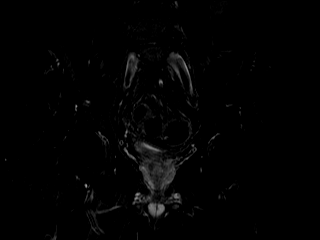
[im 14/24]
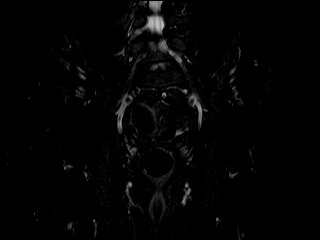
[im 16/24]
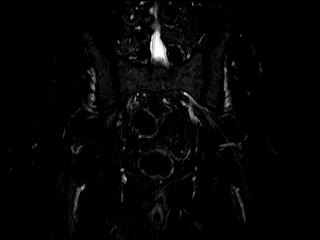
[im 20/24]
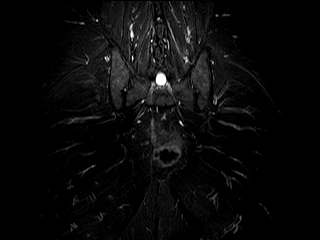
[im 24/24]
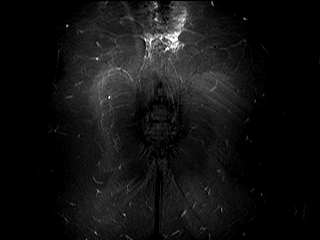

[Series 4: T1 · coronal · 4.0mm · 0.74mm/px · 9 of 17 slices shown]
[im 1/17]
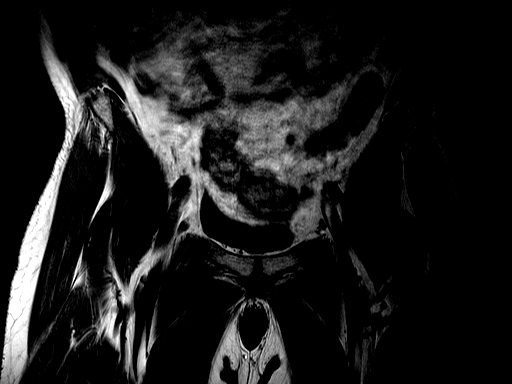
[im 3/17]
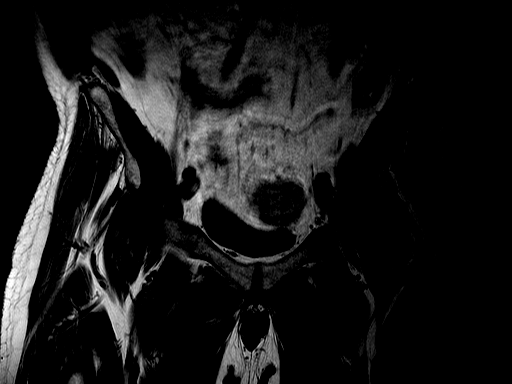
[im 5/17]
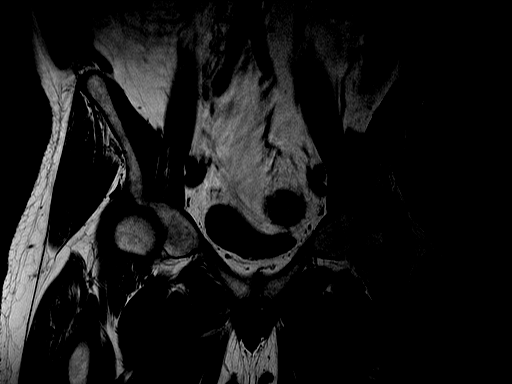
[im 7/17]
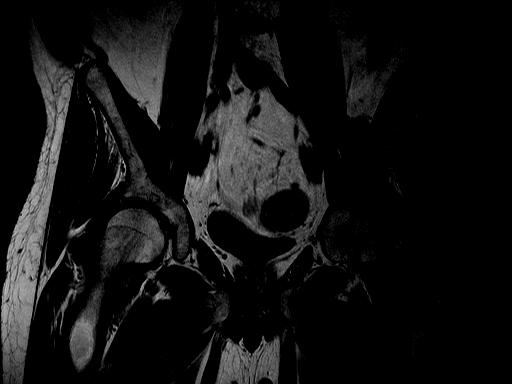
[im 9/17]
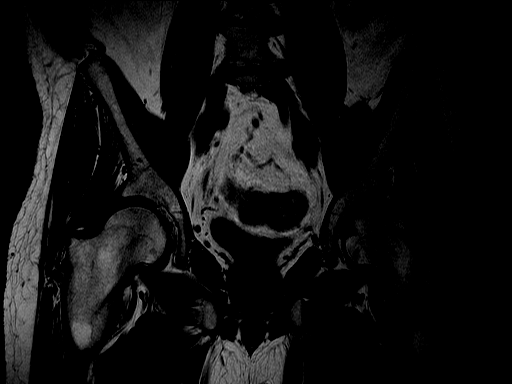
[im 11/17]
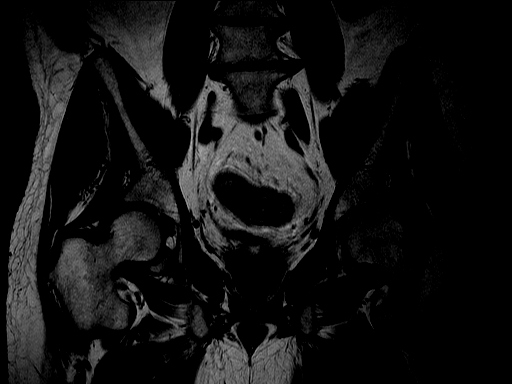
[im 13/17]
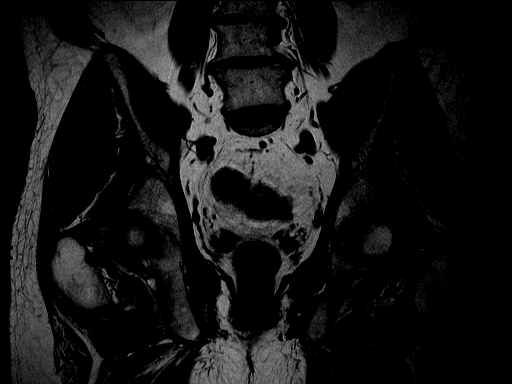
[im 15/17]
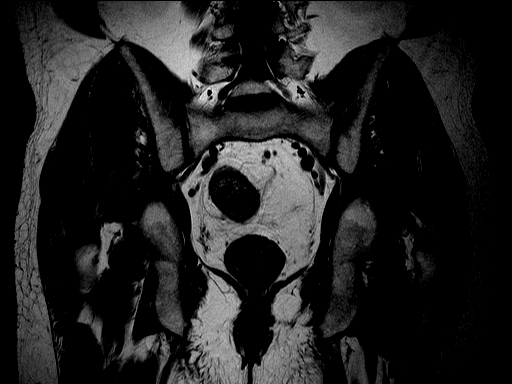
[im 17/17]
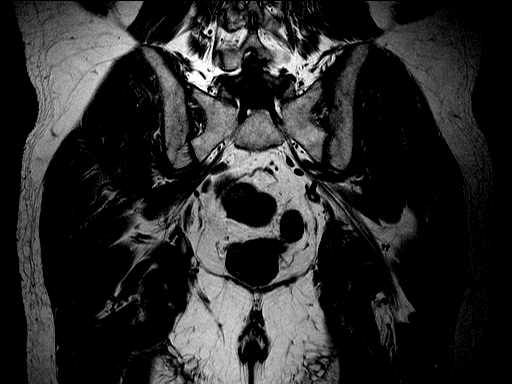

[Series 5: T2 fat-sat · axial · 5.0mm · 0.68mm/px · z∈[-105,+63]mm · 9 of 26 slices shown (1 of 2)]
[im 1/26]
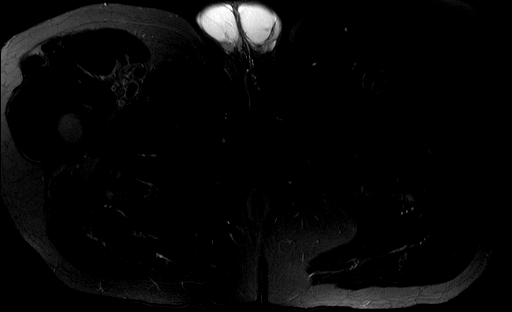
[im 2/26]
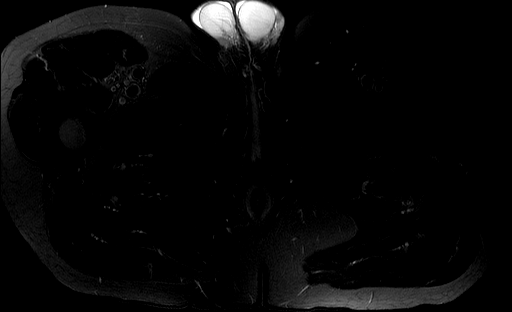
[im 4/26]
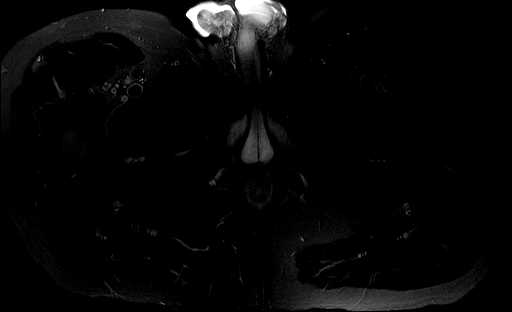
[im 8/26]
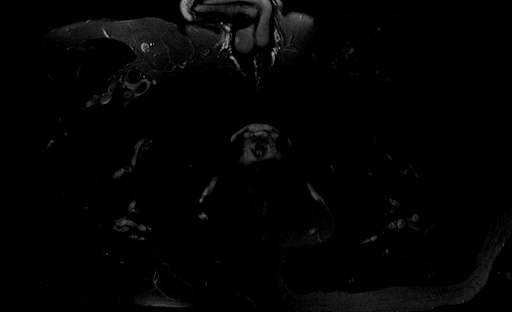
[im 12/26]
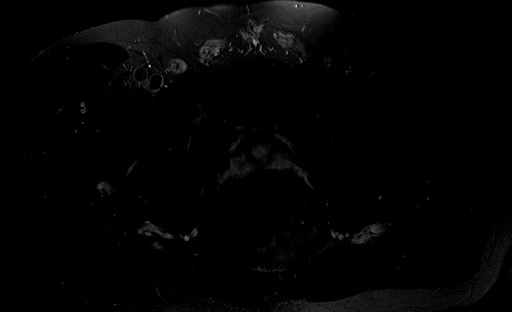
[im 14/26]
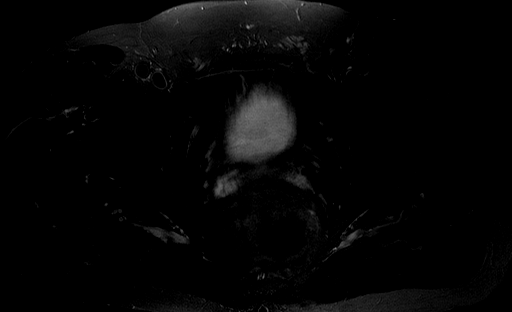
[im 18/26]
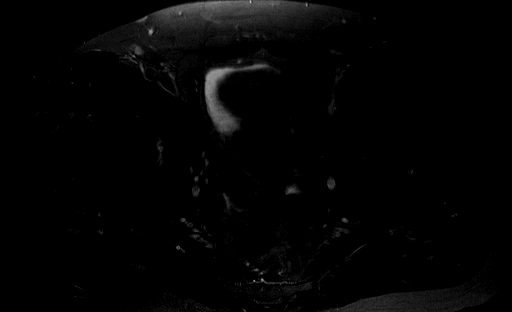
[im 22/26]
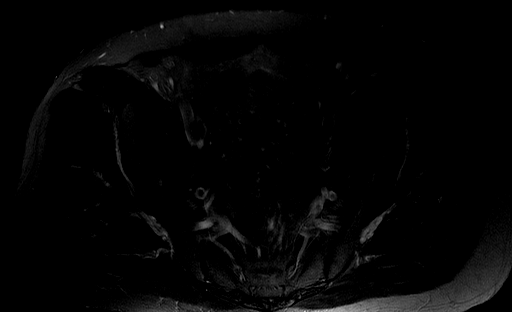
[im 26/26]
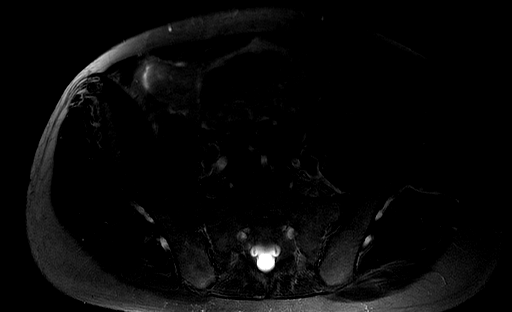

[Series 6: T2 fat-sat · sagittal · 3.5mm · 0.57mm/px · 8 of 18 slices shown (2 of 2)]
[im 1/18]
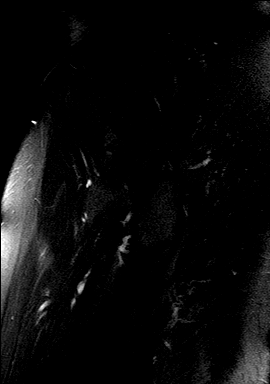
[im 2/18]
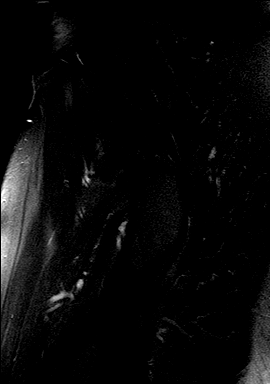
[im 6/18]
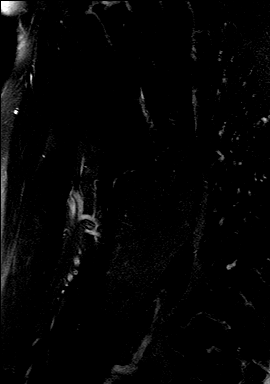
[im 8/18]
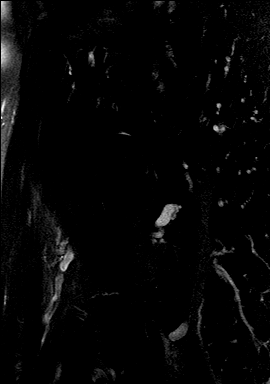
[im 10/18]
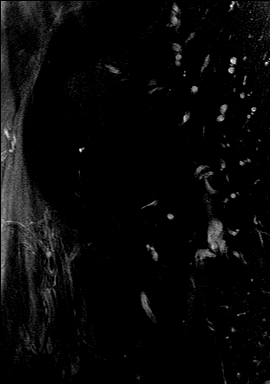
[im 12/18]
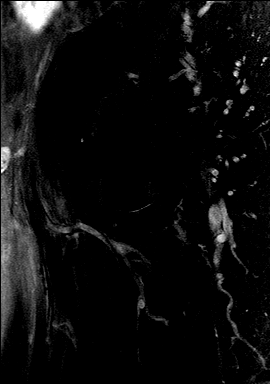
[im 16/18]
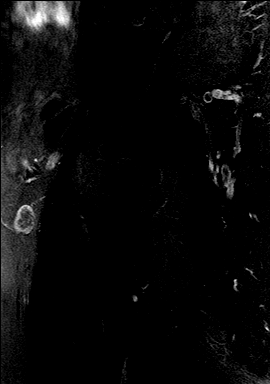
[im 18/18]
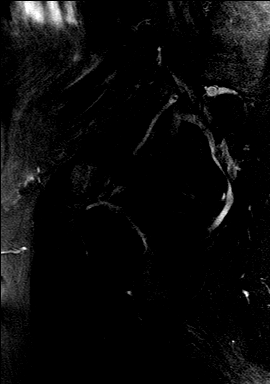

[36 of 48 positions shown; findings below may reference images not displayed]

DIAGNOSTIC STUDIES

EXAM

MRI of the right hip without contrast

INDICATION

Groin/Hip pain
RT HIP PAIN IN TO GROIN AREA, PAIN DOWN RT LEG.  RG

TECHNIQUE

Sagittal, axial, and coronal images were obtained with variable T1 and T2 weighting.

COMPARISONS

None available

FINDINGS

Moderate loss of articular cartilage is seen. No fractures or avascular necrosis is evident. There
are mild degenerative changes of the sacroiliac joints and degenerative changes lumbar spine which
are not completely imaged.

IMPRESSION

Degenerative changes as described without evidence for fracture or avascular necrosis.

Tech Notes:

RT HIP PAIN IN TO GROIN AREA, PAIN DOWN RT LEG.  RG

## 2023-07-16 ENCOUNTER — Encounter: Admit: 2023-07-16 | Discharge: 2023-07-16 | Payer: 59

## 2023-07-16 ENCOUNTER — Ambulatory Visit: Admit: 2023-07-16 | Discharge: 2023-07-17 | Payer: 59

## 2023-07-16 DIAGNOSIS — M545 Lower back pain: Secondary | ICD-10-CM

## 2023-07-16 DIAGNOSIS — G4733 Obstructive sleep apnea (adult) (pediatric): Secondary | ICD-10-CM

## 2023-07-16 DIAGNOSIS — E785 Hyperlipidemia, unspecified: Secondary | ICD-10-CM

## 2023-07-16 DIAGNOSIS — E039 Hypothyroidism, unspecified: Secondary | ICD-10-CM

## 2023-07-16 DIAGNOSIS — K219 Gastro-esophageal reflux disease without esophagitis: Secondary | ICD-10-CM

## 2023-07-16 DIAGNOSIS — G2581 Restless legs syndrome: Secondary | ICD-10-CM

## 2023-07-16 MED ORDER — IOHEXOL 240 MG IODINE/ML IV SOLN
1 mL | Freq: Once | EPIDURAL | 0 refills | Status: CP
Start: 2023-07-16 — End: ?

## 2023-07-16 MED ORDER — METHYLPREDNISOLONE ACETATE 80 MG/ML IJ SUSP
80 mg | Freq: Once | INTRAMUSCULAR | 0 refills | Status: CP
Start: 2023-07-16 — End: ?

## 2023-07-16 NOTE — Progress Notes
SPINE CENTER  INTERVENTIONAL PAIN PROCEDURE HISTORY AND PHYSICAL    Chief Complaint: Pain    HISTORY OF PRESENT ILLNESS:  pain    Past Medical History:   Diagnosis Date    Acid reflux     Hyperlipemia 07/09/2010    Hypothyroidism     Lower back pain 07/09/2010    OSA on CPAP     doesnt use currently, cannot tolerate    RLS (restless legs syndrome)        Surgical History:   Procedure Laterality Date    HX BACK SURGERY  1995    L 3-5    REINSERTION RUPTURED BICEPS/ TRICEPS TENDON WITH/ WITHOUT TENDON GRAFT - DISTAL Left 11/12/2018    Performed by Jerline Pain, MD at [facility]    ELBOW SURGERY Right     x2    HAND SURGERY Left     thumb    HX CARPAL TUNNEL RELEASE Right     HX TONSILLECTOMY      HX VASECTOMY      KNEE ARTHROSCOPY Right     KNEE ARTHROSCOPY Left        family history includes Arthritis-osteo in his father and mother; Cancer in his maternal grandmother and sister; Coronary Artery Disease in his father; High Cholesterol in his brother, mother, and sister; Hyperlipidemia in his brother and mother; Stroke in his mother.    Social History     Socioeconomic History    Marital status: Married   Tobacco Use    Smoking status: Former     Current packs/day: 0.00     Average packs/day: 1.5 packs/day for 6.0 years (9.0 ttl pk-yrs)     Types: Cigarettes     Start date: 12/01/1973     Quit date: 12/02/1979     Years since quitting: 43.6     Passive exposure: Past    Smokeless tobacco: Former     Types: Chew     Quit date: 12/01/2012   Vaping Use    Vaping status: Never Used   Substance and Sexual Activity    Alcohol use: Not Currently    Drug use: Not Currently     Types: Marijuana     Comment: ointment daily       Allergies   Allergen Reactions    Latex, Natural Rubber BLISTERS    Lortab [Hydrocodone-Acetaminophen] ITCHING     Just the Lortab brand he received caused itching, tolerates hydrocodone generics and APAP     Seasonal Allergies HEADACHE       Vitals:    07/16/23 1435   BP: 124/85   BP Source: Arm, Right Upper Pulse: 100   Resp: 16   SpO2: 99%   PainSc: Four   Weight: 88.5 kg (195 lb)   Height: 177.8 cm (5' 10)     Pain Score: Four  Oswestry Total Score:: 46    REVIEW OF SYSTEMS: 10 point ROS obtained and negative except pain      PHYSICAL EXAM:unchnaged        IMPRESSION:    1. Sacroiliac joint dysfunction of right side         PLAN: Other RT SI joint inj

## 2023-07-16 NOTE — Patient Instructions
 Procedure Completed Today: Joint Injection  sacroiliac    Important information following your procedure today: You may drive today    Pain relief may not be immediate. It is possible you may even experience an increase in pain during the first 24-48 hours followed by a gradual decrease of your pain.  Though the procedure is generally safe and complications are rare, we do ask that you be aware of any of the following:   Any swelling, persistent redness, new bleeding, or drainage from the site of the injection.  You should not experience a severe headache.  You should not run a fever over 101? F.  New onset of sharp, severe back & or neck pain.  New onset of upper or lower extremity numbness or weakness.  New difficulty controlling bowel or bladder function after the injection.  New shortness of breath.    If any of these occur, please call to report this occurrence to Dr. Welton Flakes at [phone]. If you are calling after 4:00 p.m., on weekends or holidays please call [phone] and ask to have the resident physician on call for the physician paged or go to your local emergency room.  You may experience soreness at the injection site. Ice can be applied at 20 minute intervals. Avoid application of direct heat, hot showers or hot tubs today.  Avoid strenuous activity today. You may resume your regular activities and exercise tomorrow.  Patients with diabetes may see an elevation in blood sugars for 7-10 days after the injection. It is important to pay close attention to your diet, check your blood sugars daily and report extreme elevations to the physician that treats your diabetes.  Patients taking a daily blood thinner can resume their regular dose this evening.  It is important that you take all medications ordered by your pain physician. Taking medication as ordered is an important part of your pain care plan. If you cannot continue the medication plan, please notify the physician.     Possible side effects to steroids that may occur:  Flushing or redness of the face  Irritability  Fluid retention  Change in women?s menses    The following medications were used: Lidocaine , Depomedrol, and Contrast Dye

## 2023-07-16 NOTE — Procedures
Attending Surgeon: Jerilynn Som, MD    Anesthesia: Local    Pre-Procedure Diagnosis:   1. Sacroiliac joint dysfunction of right side        Post-Procedure Diagnosis:   1. Sacroiliac joint dysfunction of right side        [facility] SI JOINT INJECT ANESTH/STEROID PROC  Location: sacroiliac joint R sacroiliac joint8/15/2024 2:30 PM    Consent:   Consent obtained: written  Consent given by: patient  Risks discussed: damage to surrounding structures, headache, hyperglycemia, nerve damage and pain  Alternatives discussed: alternative treatment, delayed treatment, no treatment and referral  Discussed with patient the purpose of the treatment/procedure, other ways of treating my condition, including no treatment/ procedure and the risks and benefits of the alternatives. Patient has decided to proceed with treatment/procedure.   Procedures Details:   Indications: pain and diagnostic evaluation   Pre Procedure Pain Level - 9  At Least Three Positive findings with provacative maneuvers including - Thigh thrust, Gaenslen's Test and FABER  Reason for Procedure - Therapeutic  The flouro-guided diagnostic SI joint injection provided greater than 75% sustained and constant pain relief for the duration of the local anesthetic, and improved function during this time.    Patient experiences moderate to severe low back pain primarily over the anatomical location of the SI joints between the upper level of the iliac crests and the gluteal fold below L5 without radiculopathy, duration of at least three (3) months, and pain persists despite a minimum of four weeks of conservative therapies.   Clinical findings and/or imaging studies do not suggest other diagnosed or obvious cause of the lumbosacral pain (such as central spinal stenosis with neurogenic claudication/myelopathy, foraminal stenosis or disc herniation with concordant radicular pain/radiculopathy, infection, tumor, fracture, pseudoarthrosis, or pain related to spinal instrumentation).   Prep: 2% chlorhexidine  Guidance: fluoroscopy  Needle size: 25 G  Approach: posterior  Patient tolerance: Patient tolerated the procedure well with no immediate complications. Pressure was applied, and hemostasis was accomplished.  Comments: 2ml lido and 80mg  depomedrol injected      Estimated blood loss: none or minimal  Specimens: none  Patient tolerated the procedure well with no immediate complications. Pressure was applied, and hemostasis was accomplished.

## 2023-07-16 NOTE — Progress Notes

## 2023-07-17 ENCOUNTER — Encounter: Admit: 2023-07-17 | Discharge: 2023-07-17 | Payer: 59

## 2023-07-17 DIAGNOSIS — Z87891 Personal history of nicotine dependence: Secondary | ICD-10-CM

## 2023-07-17 DIAGNOSIS — E785 Hyperlipidemia, unspecified: Secondary | ICD-10-CM

## 2023-07-17 DIAGNOSIS — G2581 Restless legs syndrome: Secondary | ICD-10-CM

## 2023-07-17 DIAGNOSIS — E039 Hypothyroidism, unspecified: Secondary | ICD-10-CM

## 2023-07-17 DIAGNOSIS — G4733 Obstructive sleep apnea (adult) (pediatric): Secondary | ICD-10-CM

## 2023-07-17 DIAGNOSIS — F1291 Cannabis use, unspecified, in remission: Secondary | ICD-10-CM

## 2023-07-17 DIAGNOSIS — M533 Sacrococcygeal disorders, not elsewhere classified: Secondary | ICD-10-CM

## 2023-07-17 DIAGNOSIS — Z8261 Family history of arthritis: Secondary | ICD-10-CM

## 2023-07-17 DIAGNOSIS — Z823 Family history of stroke: Secondary | ICD-10-CM

## 2023-07-17 DIAGNOSIS — Z8249 Family history of ischemic heart disease and other diseases of the circulatory system: Secondary | ICD-10-CM

## 2023-07-17 MED ORDER — MEXILETINE 150 MG PO CAP
150 mg | ORAL_CAPSULE | Freq: Two times a day (BID) | ORAL | 5 refills | Status: AC
Start: 2023-07-17 — End: ?

## 2023-07-17 MED ORDER — MEXILETINE 150 MG PO CAP
ORAL_CAPSULE | 5 refills
Start: 2023-07-17 — End: ?

## 2023-07-22 ENCOUNTER — Encounter: Admit: 2023-07-22 | Discharge: 2023-07-22 | Payer: 59

## 2023-07-29 ENCOUNTER — Encounter: Admit: 2023-07-29 | Discharge: 2023-07-29 | Payer: 59

## 2023-07-29 DIAGNOSIS — M47817 Spondylosis without myelopathy or radiculopathy, lumbosacral region: Secondary | ICD-10-CM

## 2023-07-29 DIAGNOSIS — M47812 Spondylosis without myelopathy or radiculopathy, cervical region: Secondary | ICD-10-CM

## 2023-08-24 ENCOUNTER — Encounter: Admit: 2023-08-24 | Discharge: 2023-08-24 | Payer: 59

## 2023-08-24 ENCOUNTER — Ambulatory Visit: Admit: 2023-08-24 | Discharge: 2023-08-24 | Payer: 59

## 2023-08-24 DIAGNOSIS — E039 Hypothyroidism, unspecified: Secondary | ICD-10-CM

## 2023-08-24 DIAGNOSIS — M47812 Spondylosis without myelopathy or radiculopathy, cervical region: Secondary | ICD-10-CM

## 2023-08-24 DIAGNOSIS — M4802 Spinal stenosis, cervical region: Secondary | ICD-10-CM

## 2023-08-24 DIAGNOSIS — G2581 Restless legs syndrome: Secondary | ICD-10-CM

## 2023-08-24 DIAGNOSIS — M47817 Spondylosis without myelopathy or radiculopathy, lumbosacral region: Secondary | ICD-10-CM

## 2023-08-24 DIAGNOSIS — G4733 Obstructive sleep apnea (adult) (pediatric): Secondary | ICD-10-CM

## 2023-08-24 DIAGNOSIS — M545 Lower back pain: Secondary | ICD-10-CM

## 2023-08-24 DIAGNOSIS — K219 Gastro-esophageal reflux disease without esophagitis: Secondary | ICD-10-CM

## 2023-08-24 DIAGNOSIS — E785 Hyperlipidemia, unspecified: Secondary | ICD-10-CM

## 2023-08-24 NOTE — Progress Notes
[facility] HISTORY AND PHYSICAL    Chief Complaint   Patient presents with   ? Lower Back - New Patient       Subjective      Dictation on: 08/24/2023  2:20 PM by: Lillia Pauls    Dictation on: 08/24/2023  2:26 PM by: Lillia Pauls            Past Medical History:   Diagnosis Date   ? Acid reflux    ? Hyperlipemia 07/09/2010   ? Hypothyroidism    ? Lower back pain 07/09/2010   ? OSA on CPAP     doesnt use currently, cannot tolerate   ? RLS (restless legs syndrome)        Surgical History:   Procedure Laterality Date   ? HX BACK SURGERY  1995    L 3-5   ? REINSERTION RUPTURED BICEPS/ TRICEPS TENDON WITH/ WITHOUT TENDON GRAFT - DISTAL Left 11/12/2018    Performed by Jerline Pain, MD at [facility]   ? ELBOW SURGERY Right     x2   ? HAND SURGERY Left     thumb   ? HX CARPAL TUNNEL RELEASE Right    ? HX TONSILLECTOMY     ? HX VASECTOMY     ? KNEE ARTHROSCOPY Right    ? KNEE ARTHROSCOPY Left        family history includes Arthritis-osteo in his father and mother; Cancer in his maternal grandmother and sister; Coronary Artery Disease in his father; High Cholesterol in his brother, mother, and sister; Hyperlipidemia in his brother and mother; Stroke in his mother.    Social History     Socioeconomic History   ? Marital status: Married   Tobacco Use   ? Smoking status: Former     Current packs/day: 0.00     Average packs/day: 1.5 packs/day for 6.0 years (9.0 ttl pk-yrs)     Types: Cigarettes     Start date: 12/01/1973     Quit date: 12/02/1979     Years since quitting: 43.7     Passive exposure: Past   ? Smokeless tobacco: Former     Types: Chew     Quit date: 12/01/2012   Vaping Use   ? Vaping status: Never Used   Substance and Sexual Activity   ? Alcohol use: Not Currently   ? Drug use: Not Currently     Types: Marijuana     Comment: ointment daily       Allergies   Allergen Reactions   ? Latex, Natural Rubber BLISTERS   ? Lortab [Hydrocodone-Acetaminophen] ITCHING     Just the Lortab brand he received caused itching, tolerates hydrocodone generics and APAP    ? Seasonal Allergies HEADACHE         Current Outpatient Medications:   ?  acetaminophen (TYLENOL EXTRA STRENGTH) 500 mg tablet, Take two tablets by mouth every 4-6 hours as needed for Pain. Max of 4,000 mg of acetaminophen in 24 hours., Disp: , Rfl:   ?  albuterol sulfate (PROAIR HFA) 90 mcg/actuation HFA aerosol inhaler, Inhale two puffs by mouth into the lungs every 4-6 hours as needed for Wheezing or Shortness of Breath. Shake well before use., Disp: , Rfl:   ?  ALPRAZolam (XANAX) 0.5 mg tablet, Take one tablet by mouth daily as needed., Disp: , Rfl:   ?  cetirizine (ZYRTEC) 10 mg tablet, Take one tablet by mouth daily., Disp: , Rfl:   ?  ezetimibe (ZETIA) 10 mg tablet, Take one tablet by mouth daily., Disp: , Rfl:   ?  garlic (GARLIQUE PO), Take  by mouth daily., Disp: , Rfl:   ?  ibuprofen (MOTRIN) 400 mg tablet, Take one tablet by mouth four times daily as needed for Pain. Take with food., Disp: , Rfl:   ?  krill-om-3-dha-epa-phospho-ast 500-115-30-64 mg cap, Take 1 tablet by mouth daily., Disp: , Rfl:   ?  levothyroxine (SYNTHROID) 75 mcg tablet, Take one tablet by mouth daily 30 minutes before breakfast., Disp: , Rfl:   ?  lidocaine (LIDODERM) 5 % topical patch, Apply two patches topically to affected area as Needed. Apply patch for 12 hours, then remove for 12 hours before repeating., Disp: , Rfl:   ?  mexiletine (MEXITIL) 150 mg capsule, Take one capsule by mouth twice daily., Disp: 60 capsule, Rfl: 5  ?  MULTIVITAMIN PO, Take 1 tablet by mouth daily., Disp: , Rfl:   ?  omeprazole DR (PRILOSEC) 20 mg capsule, Take one capsule by mouth twice daily., Disp: , Rfl:   ?  oxycodone HCl (OXYCODONE PO), Take 30 mg by mouth four times daily., Disp: , Rfl:   ?  pregabalin (LYRICA) 300 mg capsule, Take one capsule by mouth twice daily., Disp: 60 capsule, Rfl: 3  ?  SENNA PLUS 8.6-50 mg tablet, Take one tablet by mouth twice daily., Disp: , Rfl:   ? tamsulosin (FLOMAX) 0.4 mg capsule, Take two capsules by mouth at bedtime daily. Do not crush, chew or open capsules. Take 30 minutes following the same meal each day., Disp: , Rfl:   ?  TURMERIC ROOT EXTRACT PO, Take 1 tablet by mouth daily., Disp: , Rfl:   ?  ubidecarenone (COQ-10 PO), Take  by mouth daily., Disp: , Rfl:     Vitals:    08/24/23 1352   BP: 116/76   Pulse: 86   SpO2: 100%   PainSc: Seven   Weight: 85.1 kg (187 lb 9.6 oz)   Height: 175.3 cm (5' 9)       Oswestry Total Score:: 32    Pain Score: Seven    Body mass index is 27.7 kg/m?Marland Kitchen    Review of Systems   Constitutional:  Positive for activity change, appetite change, diaphoresis and fatigue. Negative for chills, fever and unexpected weight change.   HENT:  Positive for congestion, dental problem, hearing loss, mouth sores, sinus pressure, sinus pain, tinnitus and trouble swallowing. Negative for drooling, ear discharge, ear pain, facial swelling, nosebleeds, postnasal drip, rhinorrhea, sneezing, sore throat and voice change.    Eyes:  Positive for visual disturbance. Negative for photophobia, pain, discharge, redness and itching.   Respiratory:  Positive for apnea, cough, shortness of breath and wheezing. Negative for choking, chest tightness and stridor.    Cardiovascular:  Negative for chest pain, palpitations and leg swelling.   Endocrine: Positive for cold intolerance and heat intolerance. Negative for polydipsia, polyphagia and polyuria.   Genitourinary:  Positive for difficulty urinating. Negative for decreased urine volume, dysuria, enuresis, flank pain, frequency, genital sores, hematuria, penile discharge, penile pain, penile swelling, scrotal swelling, testicular pain and urgency.   Musculoskeletal:  Positive for back pain, gait problem, neck pain and neck stiffness. Negative for joint swelling.   Skin:  Negative for color change, pallor, rash and wound.   Allergic/Immunologic: Positive for environmental allergies. Negative for food allergies and immunocompromised state.   Neurological:  Positive for tremors, weakness, light-headedness, numbness and headaches. Negative  for dizziness, seizures, facial asymmetry and speech difficulty.   Hematological:  Does not bruise/bleed easily.   Psychiatric/Behavioral:  Positive for agitation, decreased concentration and sleep disturbance. Negative for behavioral problems, confusion, hallucinations, self-injury and suicidal ideas. The patient is nervous/anxious. The patient is not hyperactive.

## 2023-09-04 ENCOUNTER — Encounter: Admit: 2023-09-04 | Discharge: 2023-09-04 | Payer: 59

## 2023-09-10 ENCOUNTER — Encounter: Admit: 2023-09-10 | Discharge: 2023-09-10 | Payer: 59

## 2023-09-10 ENCOUNTER — Ambulatory Visit: Admit: 2023-09-10 | Discharge: 2023-09-10 | Payer: 59

## 2023-09-10 DIAGNOSIS — M545 Lower back pain: Secondary | ICD-10-CM

## 2023-09-10 DIAGNOSIS — G4733 Obstructive sleep apnea (adult) (pediatric): Secondary | ICD-10-CM

## 2023-09-10 DIAGNOSIS — E039 Hypothyroidism, unspecified: Secondary | ICD-10-CM

## 2023-09-10 DIAGNOSIS — E785 Hyperlipidemia, unspecified: Secondary | ICD-10-CM

## 2023-09-10 DIAGNOSIS — K219 Gastro-esophageal reflux disease without esophagitis: Secondary | ICD-10-CM

## 2023-09-10 DIAGNOSIS — M47817 Spondylosis without myelopathy or radiculopathy, lumbosacral region: Secondary | ICD-10-CM

## 2023-09-10 DIAGNOSIS — M5412 Radiculopathy, cervical region: Secondary | ICD-10-CM

## 2023-09-10 DIAGNOSIS — M4802 Spinal stenosis, cervical region: Secondary | ICD-10-CM

## 2023-09-10 DIAGNOSIS — M792 Neuralgia and neuritis, unspecified: Secondary | ICD-10-CM

## 2023-09-10 DIAGNOSIS — M21371 Foot drop, right foot: Secondary | ICD-10-CM

## 2023-09-10 DIAGNOSIS — G2581 Restless legs syndrome: Secondary | ICD-10-CM

## 2023-09-10 MED ORDER — TRIAMCINOLONE ACETONIDE 40 MG/ML IJ SUSP
80 mg | Freq: Once | EPIDURAL | 0 refills | Status: CP
Start: 2023-09-10 — End: ?
  Administered 2023-09-10: 20:00:00 80 mg via EPIDURAL

## 2023-09-10 MED ORDER — IOHEXOL 240 MG IODINE/ML IV SOLN
1 mL | Freq: Once | EPIDURAL | 0 refills | Status: CP
Start: 2023-09-10 — End: ?
  Administered 2023-09-10: 20:00:00 1 mL via EPIDURAL

## 2023-09-10 NOTE — Procedures
Attending Surgeon: Jerilynn Som, MD    Anesthesia: Local    Pre-Procedure Diagnosis:   1. Cervical radiculopathy    2. Cervical stenosis of spine        Post-Procedure Diagnosis:   1. Cervical radiculopathy    2. Cervical stenosis of spine        [place] [facility] SP[facility] INJECT INTERLAM CRV/THRC  Procedure: epidural - interlaminar    Laterality: n/a   on 09/10/2023 2:00 PM  Location: cervical - C7-T1      Consent:   Consent obtained: written  Consent given by: patient  Risks discussed: allergic reaction, bleeding, bruising, infection, no change or worsening in pain, reaction to medication and weakness  Alternatives discussed: alternative treatment, delayed treatment, no treatment and referral  Discussed with patient the purpose of the treatment/procedure, other ways of treating my condition, including no treatment/ procedure and the risks and benefits of the alternatives. Patient has decided to proceed with treatment/procedure.        Universal Protocol:  Relevant documents: relevant documents present and verified  Test results: test results available and properly labeled  Imaging studies: imaging studies available  Required items: required blood products, implants, devices, and special equipment available  Site marked: the operative site was marked  Patient identity confirmed: Patient identify confirmed verbally with patient.        Time out: Immediately prior to procedure a time out was called to verify the correct patient, procedure, equipment, support staff and site/side marked as required      Procedures Details:   Indications: pain and diagnostic evaluation   Prep: chlorhexidine  Patient position: prone  Estimated Blood Loss: minimal  Specimens: none  Number of Levels: 1  Approach: midline  Guidance: fluoroscopy  Contrast: Procedure confirmed with contrast under live fluoroscopy.  Needle and Epidural Catheter: tuohy  Needle size: 18 G  Injection procedure: Incremental injection, Negative aspiration for blood and Introduced needle  Patient tolerance: Patient tolerated the procedure well with no immediate complications. Pressure was applied, and hemostasis was accomplished.  Outcome: Pain improved  Comments: 2ml NS and 80mg  kenalog inj  This patient's clinical history, exam, AND imaging support radiculopathy AND there is a significant impact on quality of life and function AND the pain has been present for at least 4 weeks AND they have failed to improve with noninvasive conservative care.        Estimated blood loss: none or minimal  Specimens: none  Patient tolerated the procedure well with no immediate complications. Pressure was applied, and hemostasis was accomplished.

## 2023-09-10 NOTE — Progress Notes

## 2023-09-10 NOTE — Progress Notes

## 2023-09-10 NOTE — Progress Notes
SPINE [facility]ENTER  INTERVENTIONAL PAIN PROCEDURE HISTORY AND PHYSICAL    Chief Complaint: Pain    HISTORY OF PRESENT ILLNESS:  neck and arm pain    Past Medical History:   Diagnosis Date    Acid reflux     Hyperlipemia 07/09/2010    Hypothyroidism     Lower back pain 07/09/2010    OSA on CPAP     doesnt use currently, cannot tolerate    RLS (restless legs syndrome)        Surgical History:   Procedure Laterality Date    HX BACK SURGERY  1995    L 3-5    REINSERTION RUPTURED BICEPS/ TRICEPS TENDON WITH/ WITHOUT TENDON GRAFT - DISTAL Left 11/12/2018    Performed by Jerline Pain, MD at [facility]    ELBOW SURGERY Right     x2    HAND SURGERY Left     thumb    HX CARPAL TUNNEL RELEASE Right     HX TONSILLECTOMY      HX VASECTOMY      KNEE ARTHROSCOPY Right     KNEE ARTHROSCOPY Left        family history includes Arthritis-osteo in his father and mother; Cancer in his maternal grandmother and sister; Coronary Artery Disease in his father; High Cholesterol in his brother, mother, and sister; Hyperlipidemia in his brother and mother; Stroke in his mother.    Social History     Socioeconomic History    Marital status: Married   Tobacco Use    Smoking status: Former     Current packs/day: 0.00     Average packs/day: 1.5 packs/day for 6.0 years (9.0 ttl pk-yrs)     Types: Cigarettes     Start date: 12/01/1973     Quit date: 12/02/1979     Years since quitting: 43.8     Passive exposure: Past    Smokeless tobacco: Former     Types: Chew     Quit date: 12/01/2012   Vaping Use    Vaping status: Never Used   Substance and Sexual Activity    Alcohol use: Not Currently    Drug use: Not Currently     Types: Marijuana     Comment: ointment daily       Allergies   Allergen Reactions    Latex, Natural Rubber BLISTERS    Lortab [Hydrocodone-Acetaminophen] ITCHING     Just the Lortab brand he received caused itching, tolerates hydrocodone generics and APAP     Seasonal Allergies HEADACHE       Vitals:    09/10/23 1404   BP: 122/87   BP Source: Arm, Right Upper   Pulse: 94   Temp: 36.4 ?C (97.6 ?F)   Resp: 18   SpO2: 97%   TempSrc: Oral   PainSc: Four   Weight: 86.2 kg (190 lb)   Height: 177.8 cm (5' 10)     Pain Score: Four  Oswestry Total Score:: 42    REVIEW OF SYSTEMS: 10 point ROS obtained and negative except pain      PHYSICAL EXAM:unchnaged        IMPRESSION:    1. Cervical radiculopathy    2. Cervical stenosis of spine         PLAN: Cervical Epidural Steroid Injection

## 2023-10-14 ENCOUNTER — Encounter: Admit: 2023-10-14 | Discharge: 2023-10-14 | Payer: 59

## 2023-10-14 ENCOUNTER — Encounter: Admit: 2023-10-14 | Discharge: 2023-10-14 | Payer: PRIVATE HEALTH INSURANCE

## 2023-10-14 DIAGNOSIS — M4802 Spinal stenosis, cervical region: Secondary | ICD-10-CM

## 2023-10-22 ENCOUNTER — Encounter: Admit: 2023-10-22 | Discharge: 2023-10-22 | Payer: PRIVATE HEALTH INSURANCE

## 2023-10-22 ENCOUNTER — Ambulatory Visit: Admit: 2023-10-22 | Discharge: 2023-10-23 | Payer: PRIVATE HEALTH INSURANCE

## 2023-10-22 ENCOUNTER — Ambulatory Visit: Admit: 2023-10-22 | Discharge: 2023-10-23 | Payer: 59

## 2023-10-22 DIAGNOSIS — M47817 Spondylosis without myelopathy or radiculopathy, lumbosacral region: Secondary | ICD-10-CM

## 2023-10-22 DIAGNOSIS — M21371 Foot drop, right foot: Secondary | ICD-10-CM

## 2023-10-22 DIAGNOSIS — M792 Neuralgia and neuritis, unspecified: Secondary | ICD-10-CM

## 2023-10-22 DIAGNOSIS — M7061 Trochanteric bursitis, right hip: Secondary | ICD-10-CM

## 2023-10-22 MED ORDER — IOHEXOL 240 MG IODINE/ML IV SOLN
1 mL | Freq: Once | EPIDURAL | 0 refills | Status: CP
Start: 2023-10-22 — End: ?
  Administered 2023-10-22: 20:00:00 1 mL via EPIDURAL

## 2023-10-22 MED ORDER — METHYLPREDNISOLONE ACETATE 80 MG/ML IJ SUSP
80 mg | Freq: Once | INTRAMUSCULAR | 0 refills | Status: CP
Start: 2023-10-22 — End: ?
  Administered 2023-10-22: 20:00:00 80 mg via INTRAMUSCULAR

## 2023-10-22 NOTE — Patient Instructions
Procedure Completed Today: Joint Injection  hip    Important information following your procedure today: You may drive today    Pain relief may not be immediate. It is possible you may even experience an increase in pain during the first 24-48 hours followed by a gradual decrease of your pain.  Though the procedure is generally safe and complications are rare, we do ask that you be aware of any of the following:   Any swelling, persistent redness, new bleeding, or drainage from the site of the injection.  You should not experience a severe headache.  You should not run a fever over 101? F.  New onset of sharp, severe back & or neck pain.  New onset of upper or lower extremity numbness or weakness.  New difficulty controlling bowel or bladder function after the injection.  New shortness of breath.    If any of these occur, please call to report this occurrence to Dr. Welton Flakes at [phone]. If you are calling after 4:00 p.m., on weekends or holidays please call [phone] and ask to have the resident physician on call for the physician paged or go to your local emergency room.  You may experience soreness at the injection site. Ice can be applied at 20 minute intervals. Avoid application of direct heat, hot showers or hot tubs today.  Avoid strenuous activity today. You may resume your regular activities and exercise tomorrow.  Patients with diabetes may see an elevation in blood sugars for 7-10 days after the injection. It is important to pay close attention to your diet, check your blood sugars daily and report extreme elevations to the physician that treats your diabetes.  Patients taking a daily blood thinner can resume their regular dose this evening.  It is important that you take all medications ordered by your pain physician. Taking medication as ordered is an important part of your pain care plan. If you cannot continue the medication plan, please notify the physician.     Possible side effects to steroids that may occur:  Flushing or redness of the face  Irritability  Fluid retention  Change in women?s menses    The following medications were used: Lidocaine , Depomedrol, and Contrast Dye

## 2023-10-22 NOTE — Procedures
Attending Surgeon: Jerilynn Som, MD    Anesthesia: Local    Pre-Procedure Diagnosis:   1. Trochanteric bursitis of right hip    2. Right foot drop    3. Neuropathic pain    4. Spondylosis of lumbosacral region without myelopathy or radiculopathy        Post-Procedure Diagnosis:   1. Trochanteric bursitis of right hip    2. Right foot drop    3. Neuropathic pain    4. Spondylosis of lumbosacral region without myelopathy or radiculopathy        [facility] SP[facility] LARGE JOINT DRAIN/INJECT PROC (Rt lesser troch inj)    Consent:   Consent obtained: written  Consent given by: patient  Risks discussed: tendon rupture, subcutaneous fat atrophy, arrhythmia, bleeding, damage to surrounding structures and hyperglycemia  Alternatives discussed: alternative treatment, delayed treatment, no treatment and referral  Discussed with patient the purpose of the treatment/procedure, other ways of treating my condition, including no treatment/ procedure and the risks and benefits of the alternatives. Patient has decided to proceed with treatment/procedure.   Procedures Details:  Procedure Peformed: Injection Only  Indications: pain, joint swelling and diagnostic evaluation  Details:Prep: 2% chlorhexidine   25 G needle, fluoroscopy-guided       lateral approachOutcome: tolerated well, no immediate complications  Comments: 3ml lido and 80mg  depomedrol inj      Estimated blood loss: none or minimal  Specimens: none  Patient tolerated the procedure well with no immediate complications. Pressure was applied, and hemostasis was accomplished.

## 2023-10-22 NOTE — Progress Notes
SPINE CENTER  INTERVENTIONAL PAIN PROCEDURE HISTORY AND PHYSICAL    Chief Complaint: Pain    HISTORY OF PRESENT ILLNESS:  pain    Past Medical History:   Diagnosis Date    Acid reflux     Hyperlipemia 07/09/2010    Hypothyroidism     Lower back pain 07/09/2010    OSA on CPAP     doesnt use currently, cannot tolerate    RLS (restless legs syndrome)        Surgical History:   Procedure Laterality Date    HX BACK SURGERY  1995    L 3-5    REINSERTION RUPTURED BICEPS/ TRICEPS TENDON WITH/ WITHOUT TENDON GRAFT - DISTAL Left 11/12/2018    Performed by Jerline Pain, MD at [facility]    ELBOW SURGERY Right     x2    HAND SURGERY Left     thumb    HX CARPAL TUNNEL RELEASE Right     HX TONSILLECTOMY      HX VASECTOMY      KNEE ARTHROSCOPY Right     KNEE ARTHROSCOPY Left        family history includes Arthritis-osteo in his father and mother; Cancer in his maternal grandmother and sister; Coronary Artery Disease in his father; High Cholesterol in his brother, mother, and sister; Hyperlipidemia in his brother and mother; Stroke in his mother.    Social History     Socioeconomic History    Marital status: Married   Tobacco Use    Smoking status: Former     Current packs/day: 0.00     Average packs/day: 1.5 packs/day for 6.0 years (9.0 ttl pk-yrs)     Types: Cigarettes     Start date: 12/01/1973     Quit date: 12/02/1979     Years since quitting: 43.9     Passive exposure: Past    Smokeless tobacco: Former     Types: Chew     Quit date: 12/01/2012   Vaping Use    Vaping status: Never Used   Substance and Sexual Activity    Alcohol use: Not Currently    Drug use: Not Currently     Types: Marijuana     Comment: ointment daily       Allergies   Allergen Reactions    Latex, Natural Rubber BLISTERS    Lortab [Hydrocodone-Acetaminophen] ITCHING     Just the Lortab brand he received caused itching, tolerates hydrocodone generics and APAP     Seasonal Allergies HEADACHE       Vitals:    10/22/23 1403   PainSc: Five     Pain Score: Five  Oswestry Total Score:: (Patient-Rptd) 46    REVIEW OF SYSTEMS: 10 point ROS obtained and negative except pain      PHYSICAL EXAM:unchnaged        IMPRESSION:    1. Trochanteric bursitis of right hip    2. Right foot drop    3. Neuropathic pain    4. Spondylosis of lumbosacral region without myelopathy or radiculopathy         PLAN: Other rt hip inj

## 2023-10-22 NOTE — Progress Notes

## 2023-12-29 ENCOUNTER — Encounter: Admit: 2023-12-29 | Discharge: 2023-12-29 | Payer: PRIVATE HEALTH INSURANCE

## 2024-01-08 ENCOUNTER — Encounter: Admit: 2024-01-08 | Discharge: 2024-01-08 | Payer: 59

## 2024-01-08 MED ORDER — MEXILETINE 150 MG PO CAP
150 mg | ORAL_CAPSULE | Freq: Two times a day (BID) | ORAL | 1 refills | Status: AC
Start: 2024-01-08 — End: ?

## 2024-01-08 NOTE — Telephone Encounter
Please change the prescription to reflect 150 mg one tab BID.  Qty 60 with 5 refills.   Lauro Franklin, RN to Apolinar Junes., DO   RM    07/17/23 11:31 AM  Called patient who states he only takes 150 mg of Mexilitine twice a day.   Apolinar Junes., DO to Lauro Franklin, RN       07/17/23 10:37 AM    LOV 06.20.2024  NOV 01.28.2025    From assess/plan on 06.20.2024:      1. DC Nortriptyline and Metaxalone  2. Start Cymbalta 30 mg qdx7d then 60 mg every day  3. Continue Lyrica 300 mg BID  4. Patient stopped PT - made pain worse  5. FU anesthesia pain mgmt  6. Refer to Rehab Medicine - improve mobility and ambulation  7. Continue Mexiletine  8. Consider spinal stimulator trial - pain mgmt  9. Vit B12, ESR, MMA, HIV, A1c, ANA  10. Symptomatic mgmt - no structural neurologic lesion found.  Imaging/Edx testing does not explain degree of pain/weakness.   Approved refill

## 2024-07-07 ENCOUNTER — Encounter: Admit: 2024-07-07 | Discharge: 2024-07-07 | Payer: PRIVATE HEALTH INSURANCE

## 2024-07-07 MED ORDER — MEXILETINE 150 MG PO CAP
150 mg | ORAL_CAPSULE | Freq: Two times a day (BID) | ORAL | 0 refills | 30.00000 days | Status: AC
Start: 2024-07-07 — End: ?

## 2024-07-07 NOTE — Telephone Encounter
 Last office visit: 05/21/2023    Next office visit: Not scheduled --> MyChart message sent     Last filled: 06/14/2024    Per LOV note, plan:  DC Nortriptyline and Metaxalone  Start Cymbalta  30 mg qdx7d then 60 mg every day  Continue Lyrica  300 mg BID  Patient stopped PT - made pain worse  FU anesthesia pain mgmt  Refer to Rehab Medicine - improve mobility and ambulation  Continue Mexiletine  Consider spinal stimulator trial - pain mgmt  Vit B12, ESR, MMA, HIV, A1c, ANA  Symptomatic mgmt - no structural neurologic lesion found.  Imaging/Edx testing does not explain degree of pain/weakness.      Approved Refill 90-day supply sent to allow for scheduling

## 2024-07-19 ENCOUNTER — Encounter: Admit: 2024-07-19 | Discharge: 2024-07-19 | Payer: PRIVATE HEALTH INSURANCE

## 2024-07-19 NOTE — Telephone Encounter
 Call to pt and he accepts appt tomorrow at 1245.  Reviewed our address and below info.  He said he does NOT see a rheum.      Appts can last 1-4 hours due to extensive testing.  Please plan on being here in our office that long.     You will see multiple team members including medical students and residents as we are part of a teaching health system.     Please arrive 15 min before your appt to check in.     Please bring:   your insurance card     Your photo ID     List of medications     Your current eyeglasses      You will be dilated for this exam.  If you are not comfortable driving home after being dilated, please arrange for someone to accompany you and drive you or alternate transportation.  Blurry vision lasts about 4 hours after being dilated.     Our specialists do not write you new eyeglasses or contacts prescription at this appt.       ~Hodan Wurtz RN   [phone]

## 2024-07-20 ENCOUNTER — Encounter: Admit: 2024-07-20 | Discharge: 2024-07-20 | Payer: PRIVATE HEALTH INSURANCE

## 2024-07-20 ENCOUNTER — Ambulatory Visit: Admit: 2024-07-20 | Discharge: 2024-07-21 | Payer: PRIVATE HEALTH INSURANCE

## 2024-07-20 ENCOUNTER — Ambulatory Visit: Admit: 2024-07-20 | Discharge: 2024-07-20 | Payer: PRIVATE HEALTH INSURANCE

## 2024-07-22 ENCOUNTER — Encounter: Admit: 2024-07-22 | Discharge: 2024-07-22 | Payer: PRIVATE HEALTH INSURANCE

## 2024-07-22 NOTE — Telephone Encounter
 Hello, I sent the following via [facility]:     [facility]: Joshua Vasquez, the office of Dr. Valda would like to see you back at a very specific time and date: Mon. July 25, 2024 at 11:00 AM. I have scheduled that appointment for you, and you should receive a notification giving you the details of the appointment. Please call [phone] if this date/time does not work. Thanks!

## 2024-07-25 ENCOUNTER — Encounter: Admit: 2024-07-25 | Discharge: 2024-07-25 | Payer: PRIVATE HEALTH INSURANCE

## 2024-07-25 ENCOUNTER — Ambulatory Visit: Admit: 2024-07-25 | Discharge: 2024-07-26 | Payer: PRIVATE HEALTH INSURANCE

## 2024-07-25 NOTE — Progress Notes
 Assessment and Plan:    Toxoplasmosis Chorioretinitis OS  -Presented to Dr. Dennise ([facility]) with optic nerve edema OS and retinitis OS  -Presented 07/2024 with recurrence OS with Dr. Dennise, without obvious retinitis  ROS: negative  Laboratory work up: not available TB[] , Syphilis []  Chest X-ray []  ACE [] , lysozyme [] , B 27[] , HLA A 29 [] , urine B2 microglobulin    On consultation (07/24/24), vitreous inflammation OS, optic nerve edema OS, no obvious retinitis? Old scar adjacent to the nerve, OCT over the area     Important Dates  Flares:   Injection Hx:  Failed Tx:  Current Tx:  Last CBC/CMP, QuantiFERON, DEXA:  LV rheum:    Plan:  On exam today, exam is consistent with previous toxo retinitis, treated with bactrim and resolved in the past. Toxo IGG from outside posiitive.     Continue 40 mg of prednisone and Bactrim BID  OCT-n improving    DFE OS, OCT-mac, OCTn (please progress from baseline pictures)       Joshua LOISE Dull, MD  [facility]      HPI:  Constant speckles, intermittent floater. Speckles are lower on the line of sight with prednisone, patient states he is looking over the speckles instead of looking straight through them.   No flashes   No redness or irritation of the eye   No eye pain   Just started prednisone this past Saturday (8/23), has been using it for three days  Dry eyes have been bothering him, mostly bothersome at night or in the morning. Using blink tears occasionally.     Exam:  Base Eye Exam       Visual Acuity (Snellen - Linear)         Right Left    Dist cc 20/20 20/50    Dist ph cc  20/NI      Correction: Glasses   Pt c/o floater in vision OS             Tonometry (Applanation, 10:58 AM)         Right Left    Pressure 15 14              Pupils         Pupils Dark Shape React APD    Right PERRL 3 Round Minimal None    Left PERRL 3 Round Minimal 2+              Neuro/Psych       Oriented x3: Yes    Mood/Affect: Normal                  Slit Lamp and Fundus Exam       External Exam         Right Left    External Normal Normal              Slit Lamp Exam         Right Left    Lids/Lashes Normal Normal    Conjunctiva/Sclera 1+ injection tr injection    Cornea Clear, TF debris PEE, TF debris    Anterior Chamber Deep and quiet Deep and quiet    Iris Flat, no nodules Flat, no nodules    Lens 1+ NS 1+ NS    Vitreous Normal 2+ vitreous cell, PVD              Fundus Exam         Right Left  Disc Sharp healthy rim Less hazy view, improved optic disc edema    C/D Ratio 0.3 difficult to assess    Macula Flat Flat    Vessels Normal caliber and number Normal caliber and number    Periphery Attached no breaks or tears Chorioretinal lesions over the optic nerve, macula, atrophic scar superotemporal

## 2024-07-26 DIAGNOSIS — B5801 Toxoplasma chorioretinitis: Principal | ICD-10-CM

## 2024-07-29 ENCOUNTER — Encounter: Admit: 2024-07-29 | Discharge: 2024-07-29 | Payer: PRIVATE HEALTH INSURANCE

## 2024-07-29 ENCOUNTER — Ambulatory Visit: Admit: 2024-07-29 | Discharge: 2024-07-30 | Payer: PRIVATE HEALTH INSURANCE

## 2024-08-17 ENCOUNTER — Encounter: Admit: 2024-08-17 | Discharge: 2024-08-17 | Payer: PRIVATE HEALTH INSURANCE

## 2024-08-17 ENCOUNTER — Ambulatory Visit: Admit: 2024-08-17 | Discharge: 2024-08-18 | Payer: PRIVATE HEALTH INSURANCE

## 2024-08-17 DIAGNOSIS — H44112 Panuveitis, left eye: Principal | ICD-10-CM

## 2024-08-17 MED ORDER — SULFAMETHOXAZOLE-TRIMETHOPRIM 800-160 MG PO TAB
1 | ORAL_TABLET | Freq: Two times a day (BID) | ORAL | 3 refills | 28.00000 days | Status: AC
Start: 2024-08-17 — End: ?

## 2024-08-17 NOTE — Patient Instructions
 20 of prednisone for  a week  10 of prednisone for a week then STOP    Continue Bactrim  twice a day

## 2024-08-17 NOTE — Progress Notes
 Assessment and Plan:    Toxoplasmosis Chorioretinitis OS  -Presented to Dr. Dennise ([facility]) with optic nerve edema OS and retinitis OS  -Presented 07/2024 with recurrence OS with Dr. Dennise, without obvious retinitis  ROS: negative  Laboratory work up: not available TB[] , Syphilis []  Chest X-ray []  ACE [] , lysozyme [] , B 27[] , HLA A 29 [] , urine B2 microglobulin    On consultation (07/24/24), vitreous inflammation OS, optic nerve edema OS, no obvious retinitis? Old scar adjacent to the nerve, OCT over the area , APD OS     Haws been improving on pred and Bactrim      Important Dates  Flares:   Injection Hx:  Failed Tx:  Current Tx:  Last CBC/CMP, QuantiFERON, DEXA:  LV rheum:    Plan:  On exam today, exam is consistent with previous toxo retinitis, treated with bactrim  and resolved in the past. Toxo IGG from outside posiitive.     Decrease to 20mg  of prednisone for a week then 10 mg of prednisone for a week then STOP and CONTINUE Bactrim  BID  OCT-n improving- at pretty much normal now (average RNFL thicknes is 97, superior quadrant is 137)    See Dr. Dennise at [facility] in 4 weeks for continued improvement, can taper Bactrim  to MWF at that appointment or continue at current dosage     3 months DFE OS, OCT-mac, OCTn (please progress from baseline pictures)       Areta LOISE Dull, MD  [facility]      HPI:  No recent vaccinations. No recent illnesses. No recent urgent care/ER visits.     Interval health updates:    Ocular symptoms:     Medications:  Bactrim  BID  Prednisone 30 mg daily     Exam:  Base Eye Exam       Visual Acuity (Snellen - Linear)         Right Left    Dist sc 20/20 20/25              Tonometry (Applanation, 12:53 PM)         Right Left    Pressure 15 15                  Slit Lamp and Fundus Exam       External Exam         Right Left    External Normal Normal              Slit Lamp Exam         Right Left    Lids/Lashes Normal Normal    Conjunctiva/Sclera 1+ injection tr injection Cornea Clear, TF debris PEE, TF debris    Anterior Chamber Deep and quiet Deep and quiet    Iris Flat, no nodules Flat, no nodules    Lens 1+ NS 1+ NS    Vitreous Normal improved vitreous cell, PVD              Fundus Exam         Right Left    Disc  Less hazy view, improved optic disc edema    C/D Ratio  difficult to assess    Macula  Flat    Vessels  Normal caliber and number    Periphery  Active chorioretinal lesion - now less prominent?over the optic nerve, macula, atrophic scar superotemporal

## 2024-08-30 ENCOUNTER — Encounter: Admit: 2024-08-30 | Discharge: 2024-08-30 | Payer: PRIVATE HEALTH INSURANCE

## 2024-08-30 ENCOUNTER — Ambulatory Visit: Admit: 2024-08-30 | Discharge: 2024-08-30 | Payer: PRIVATE HEALTH INSURANCE

## 2024-09-01 ENCOUNTER — Encounter: Admit: 2024-09-01 | Discharge: 2024-09-01 | Payer: PRIVATE HEALTH INSURANCE

## 2024-09-02 ENCOUNTER — Encounter: Admit: 2024-09-02 | Discharge: 2024-09-02 | Payer: PRIVATE HEALTH INSURANCE

## 2024-09-15 ENCOUNTER — Encounter: Admit: 2024-09-15 | Discharge: 2024-09-15 | Payer: PRIVATE HEALTH INSURANCE

## 2024-09-20 ENCOUNTER — Encounter: Admit: 2024-09-20 | Discharge: 2024-09-20 | Payer: PRIVATE HEALTH INSURANCE

## 2024-09-20 DIAGNOSIS — M4802 Spinal stenosis, cervical region: Principal | ICD-10-CM

## 2024-09-29 ENCOUNTER — Encounter: Admit: 2024-09-29 | Discharge: 2024-09-29 | Payer: PRIVATE HEALTH INSURANCE

## 2024-09-29 ENCOUNTER — Ambulatory Visit: Admit: 2024-09-29 | Discharge: 2024-09-29 | Payer: PRIVATE HEALTH INSURANCE

## 2024-09-29 VITALS — BP 109/75 | HR 87 | Ht 70.0 in | Wt 180.0 lb

## 2024-09-29 DIAGNOSIS — M542 Cervicalgia: Secondary | ICD-10-CM

## 2024-09-29 DIAGNOSIS — M4802 Spinal stenosis, cervical region: Principal | ICD-10-CM

## 2024-09-29 NOTE — Progress Notes [1]
 [facility] CLINIC NOTE       Subjective: Joshua Vasquez returns today for repeat evaluation of his cervical spine.  He was last seen in our office on August 24, 2023.  He was sent for an epidural steroid injection which he completed with Dr. Nelida on October 22, 2023.  This was a C7-T1 interlaminar injection.  Mr. Salsgiver reports that it did not help him at all.  He got no benefit from the injection at all.  Continues to describe primarily neck pain but also has component of arm pain.    Objective: Motors, reflex, sensation intact stable.    Radiographs: There is a new cervical MRI available for review dated August 30, 2024.  It is largely unchanged from his previous MRI in 24 shows spinal stenosis greatest at C3-4.  There is a fluoroscopy image available for review dated October 22, 2023 shows a C7-T1 interlaminar injection.    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    Plan: Had a lengthy discussion with the patient regarding pump treatment alternatives.  We did review his x-rays and new MRI with him as well as the pathoanatomy and pathophysiology problem.  Went over his options for treatment.  The fact that the injection did not help him at all does not give us  much confidence that taking the next step surgery would help him.  I would recommend trying a second epidural this time a bilateral C3-4 transforaminal epidural.  Also recommend getting him started in some formalized physical therapy.  I think if that injection helps but does not last he potentially could become a surgical candidate.  However, the fact that he has mechanical symptoms, I think points to a problem that is can to be hard to make better with surgery.  Overall, unless he gets a homerun with this 2nd epidural, we would recommend that he try to avoid surgical treatment.  He voiced understanding is in agreement.  He is going to give us  a call if he wants to talk more about an operation were happy to see him back at any point.         Review of Systems  Current Medications[1]  Allergies[2]  Physical Exam  Vitals:    09/29/24 1452   BP: 109/75   Pulse: 87   SpO2: 99%   PainSc: Four   Weight: 81.6 kg (180 lb)   Height: 177.8 cm (5' 10)     Oswestry Total Score:: (Patient-Rptd) 38  Pain Score: Four  Body mass index is 25.83 kg/m?.                         [1]   Current Outpatient Medications:     acetaminophen  (TYLENOL  EXTRA STRENGTH) 500 mg tablet, Take two tablets by mouth every 4-6 hours as needed for Pain. Max of 4,000 mg of acetaminophen  in 24 hours., Disp: , Rfl:     albuterol sulfate (PROAIR HFA) 90 mcg/actuation HFA aerosol inhaler, Inhale two puffs by mouth into the lungs every 4-6 hours as needed for Wheezing or Shortness of Breath. Shake well before use., Disp: , Rfl:     ALPRAZolam (XANAX) 0.5 mg tablet, Take one tablet by mouth daily as needed., Disp: , Rfl:     cetirizine (ZYRTEC) 10 mg tablet, Take one tablet by mouth daily., Disp: , Rfl:     ezetimibe (ZETIA) 10 mg tablet, Take one tablet by mouth  daily., Disp: , Rfl:     garlic (GARLIQUE PO), Take  by mouth daily., Disp: , Rfl:     ibuprofen (MOTRIN) 400 mg tablet, Take one tablet by mouth four times daily as needed for Pain. Take with food., Disp: , Rfl:     krill-om-3-dha-epa-phospho-ast 500-115-30-64 mg cap, Take 1 tablet by mouth daily., Disp: , Rfl:     levothyroxine (SYNTHROID) 75 mcg tablet, Take one tablet by mouth daily 30 minutes before breakfast., Disp: , Rfl:     lidocaine  (LIDODERM ) 5 % topical patch, Apply two patches topically to affected area as Needed. Apply patch for 12 hours, then remove for 12 hours before repeating., Disp: , Rfl:     mexiletine (MEXITIL ) 150 mg capsule, TAKE ONE CAPSULE BY MOUTH TWICE DAILY, Disp: 180 capsule, Rfl: 0    MULTIVITAMIN PO, Take 1 tablet by mouth daily., Disp: , Rfl:     omeprazole DR (PRILOSEC) 20 mg capsule, Take one capsule by mouth twice daily., Disp: , Rfl: oxycodone  HCl (OXYCODONE  PO), Take 30 mg by mouth four times daily., Disp: , Rfl:     predniSONE (DELTASONE) 10 mg tablet, Take four tablets by mouth daily., Disp: 120 tablet, Rfl: 1    pregabalin  (LYRICA ) 300 mg capsule, Take one capsule by mouth twice daily., Disp: 60 capsule, Rfl: 3    SENNA PLUS 8.6-50 mg tablet, Take one tablet by mouth twice daily., Disp: , Rfl:     sulfamethoxazole  800 mg-trimethoprim  160 mg tablet (BACTRIM  DS), Take one tablet by mouth twice daily for 120 days., Disp: 60 tablet, Rfl: 3    tamsulosin (FLOMAX) 0.4 mg capsule, Take two capsules by mouth at bedtime daily. Do not crush, chew or open capsules. Take 30 minutes following the same meal each day., Disp: , Rfl:     TURMERIC ROOT EXTRACT PO, Take 1 tablet by mouth daily., Disp: , Rfl:     ubidecarenone (COQ-10 PO), Take  by mouth daily., Disp: , Rfl:   [2]   Allergies  Allergen Reactions    Latex, Natural Rubber BLISTERS    Lortab [Hydrocodone-Acetaminophen ] ITCHING     Just the Lortab brand he received caused itching, tolerates hydrocodone generics and APAP     Seasonal Allergies HEADACHE

## 2024-10-10 ENCOUNTER — Encounter: Admit: 2024-10-10 | Discharge: 2024-10-10 | Payer: PRIVATE HEALTH INSURANCE

## 2024-10-13 ENCOUNTER — Encounter: Admit: 2024-10-13 | Discharge: 2024-10-13 | Payer: PRIVATE HEALTH INSURANCE

## 2024-10-20 ENCOUNTER — Encounter: Admit: 2024-10-20 | Discharge: 2024-10-20 | Payer: PRIVATE HEALTH INSURANCE

## 2024-10-20 ENCOUNTER — Ambulatory Visit: Admit: 2024-10-20 | Discharge: 2024-10-21 | Payer: PRIVATE HEALTH INSURANCE

## 2024-10-20 ENCOUNTER — Ambulatory Visit: Admit: 2024-10-20 | Discharge: 2024-10-20 | Payer: PRIVATE HEALTH INSURANCE

## 2024-11-02 NOTE — Progress Notes [1]
 Assessment and Plan:    Toxoplasmosis Chorioretinitis OS  -Presented to Dr. Dennise ([facility]) with optic nerve edema OS and retinitis OS  -Presented 07/2024 with recurrence OS with Dr. Dennise, without obvious retinitis  ROS: negative  Laboratory work up: not available TB[] , Syphilis []  Chest X-ray []  ACE [] , lysozyme [] , B 27[] , HLA A 29 [] , urine B2 microglobulin    On consultation (07/24/24), vitreous inflammation OS, optic nerve edema OS, no obvious retinitis? Old scar adjacent to the nerve, OCT over the area , APD OS     Now off pred, on bactrim  BID (had a week off of bactrim  and symptoms recurred)     Important Dates  Flares:   Injection Hx:  Failed Tx:  Current Tx:  Last CBC/CMP, QuantiFERON, DEXA:  LV rheum:    Plan:  On exam today, exam is consistent with previous toxo retinitis, treated with bactrim  and resolved in the past. Toxo IGG from outside posiitive.     Stay off prednisone    Bactrim  BID (will consider going to MWF on next visit)      3 months DFE OS, OCT-mac, OCTn (please progress from baseline pictures)       Areta LOISE Dull, MD  [facility]      HPI:  Patient presents with:  Eye Pain: No recent vaccinations. No recent illnesses. No recent urgent care/ER visits.     Interval health updates: pt states overall health has been good stable.     Ocular symptoms: due to pt not having the bact, he has had the floater in his vision and has been bothersome. Pt states its hard to tell which eye it's in. Pt denies flashes.     Other: Medications: Decrease to 20mg  of prednisone for a week then 10 mg of prednisone for a week then STOP and CONTINUE Bactrim  BID  OCT-n improving- at pretty much normal now (average RNFL thicknes is 97, superior quadrant is 137)      Exam:  Base Eye Exam       Visual Acuity (Snellen - Linear)         Right Left    Dist cc 20/25 20/20      Correction: Glasses              Tonometry ([employer] Tonometer, 1:13 PM)         Right Left    Pressure 10 10 Pupils         Dark Shape React APD    Right 3 Round Minimal None    Left 3 Round Minimal None              Visual Fields         Left Right     Full Full              Extraocular Movement         Right Left     Full Full              Neuro/Psych       Oriented x3: Yes    Mood/Affect: Normal              Dilation       Both eyes: 2.5% Phenylephrine , 1.0% Tropicamide @ 1:13 PM                  Slit Lamp and Fundus Exam       External Exam  Right Left    External Normal Normal              Slit Lamp Exam         Right Left    Lids/Lashes Normal Normal    Conjunctiva/Sclera 1+ injection tr injection    Cornea Clear, TF debris PEE, TF debris    Anterior Chamber Deep and quiet Deep and quiet    Iris Flat, no nodules Flat, no nodules    Lens 1+ NS 1+ NS    Vitreous Normal improved vitreous cell, PVD              Fundus Exam         Right Left    Disc Sharp healthy rim Less hazy view, improved optic disc edema    C/D Ratio  difficult to assess    Macula Flat Flat    Vessels Normal caliber and number Normal caliber and number    Periphery Attached no breaks or tears Active chorioretinal lesion - now less prominent?over the optic nerve, macula, atrophic scar superotemporal

## 2024-11-07 ENCOUNTER — Encounter: Admit: 2024-11-07 | Discharge: 2024-11-07 | Payer: PRIVATE HEALTH INSURANCE

## 2024-11-16 ENCOUNTER — Encounter: Admit: 2024-11-16 | Discharge: 2024-11-16 | Payer: PRIVATE HEALTH INSURANCE

## 2024-11-16 ENCOUNTER — Ambulatory Visit: Admit: 2024-11-16 | Discharge: 2024-11-17 | Payer: PRIVATE HEALTH INSURANCE

## 2024-11-16 DIAGNOSIS — B5801 Toxoplasma chorioretinitis: Secondary | ICD-10-CM

## 2024-11-16 DIAGNOSIS — H44112 Panuveitis, left eye: Principal | ICD-10-CM

## 2024-11-25 ENCOUNTER — Encounter: Admit: 2024-11-25 | Discharge: 2024-11-25 | Payer: PRIVATE HEALTH INSURANCE

## 2024-11-25 NOTE — Telephone Encounter [36]
 Pt has appt with Dr. Leonce on 12/30 and feels he will be canceling this appt.  He will let us  know

## 2024-11-28 ENCOUNTER — Encounter: Admit: 2024-11-28 | Discharge: 2024-11-28 | Payer: PRIVATE HEALTH INSURANCE

## 2024-11-28 ENCOUNTER — Ambulatory Visit: Admit: 2024-11-28 | Discharge: 2024-11-29 | Payer: PRIVATE HEALTH INSURANCE
